# Patient Record
Sex: Female | Born: 1940 | Race: White | Hispanic: No | Smoking: Current every day smoker
Health system: Southern US, Community
[De-identification: ages and names within clinical notes are randomized; demographics above are authoritative.]

## PROBLEM LIST (undated history)

## (undated) DIAGNOSIS — E119 Type 2 diabetes mellitus without complications: Secondary | ICD-10-CM

## (undated) HISTORY — PX: CHOLECYSTECTOMY: SHX55

## (undated) HISTORY — PX: TONSILLECTOMY: SUR1361

## (undated) HISTORY — PX: BLEPHAROPLASTY: SUR158

## (undated) HISTORY — PX: KNEE ARTHROSCOPY: SHX127

---

## 2004-08-17 ENCOUNTER — Ambulatory Visit: Payer: Self-pay | Admitting: Internal Medicine

## 2006-12-23 ENCOUNTER — Ambulatory Visit: Payer: Self-pay | Admitting: Internal Medicine

## 2008-10-14 ENCOUNTER — Ambulatory Visit: Payer: Self-pay | Admitting: Internal Medicine

## 2009-10-25 ENCOUNTER — Ambulatory Visit: Payer: Self-pay | Admitting: Internal Medicine

## 2009-11-16 ENCOUNTER — Ambulatory Visit: Payer: Self-pay | Admitting: Internal Medicine

## 2010-04-18 ENCOUNTER — Ambulatory Visit (HOSPITAL_COMMUNITY): Admission: RE | Admit: 2010-04-18 | Discharge: 2010-04-18 | Payer: Self-pay | Admitting: Orthopedic Surgery

## 2011-01-28 LAB — COMPREHENSIVE METABOLIC PANEL
ALT: 18 U/L (ref 0–35)
AST: 19 U/L (ref 0–37)
BUN: 8 mg/dL (ref 6–23)
CO2: 29 mEq/L (ref 19–32)
Calcium: 10 mg/dL (ref 8.4–10.5)
Chloride: 107 mEq/L (ref 96–112)
GFR calc Af Amer: >60 mL/min (ref >60)
GFR calc non Af Amer: >60 mL/min (ref >60)
Glucose, Bld: 148 mg/dL — ABNORMAL HIGH (ref 70–99)
Sodium: 143 mEq/L (ref 135–145)
Total Bilirubin: 0.5 mg/dL (ref 0.3–1.2)
Total Protein: 7.2 g/dL (ref 6.0–8.3)

## 2011-01-28 LAB — DIFFERENTIAL
Lymphocytes Relative: 22 % (ref 12–46)
Monocytes Absolute: 0.6 10*3/uL (ref 0.1–1.0)
Monocytes Relative: 5 % (ref 3–12)

## 2011-01-28 LAB — CBC
HCT: 46.2 % — ABNORMAL HIGH (ref 36.0–46.0)
MCV: 93.5 fL (ref 78.0–100.0)
RBC: 4.94 MIL/uL (ref 3.87–5.11)
RDW: 14.1 % (ref 11.5–15.5)

## 2011-01-28 LAB — GLUCOSE, CAPILLARY: Glucose-Capillary: 132 mg/dL — ABNORMAL HIGH (ref 70–99)

## 2011-01-28 LAB — APTT: aPTT: 32 seconds (ref 24–37)

## 2011-01-28 LAB — URINALYSIS, ROUTINE W REFLEX MICROSCOPIC
Bilirubin Urine: NEGATIVE
Nitrite: NEGATIVE
pH: 6.5 (ref 5.0–8.0)

## 2014-09-14 ENCOUNTER — Ambulatory Visit: Payer: Self-pay | Admitting: Internal Medicine

## 2014-10-18 ENCOUNTER — Ambulatory Visit: Payer: Self-pay | Admitting: Internal Medicine

## 2019-07-14 ENCOUNTER — Other Ambulatory Visit: Payer: Self-pay | Admitting: Internal Medicine

## 2019-07-14 DIAGNOSIS — D119 Benign neoplasm of major salivary gland, unspecified: Secondary | ICD-10-CM

## 2019-08-14 ENCOUNTER — Ambulatory Visit
Admission: RE | Admit: 2019-08-14 | Discharge: 2019-08-14 | Disposition: A | Discharge status: Home / Self Care | Payer: Medicare Other | Source: Ambulatory Visit | Attending: Internal Medicine | Admitting: Internal Medicine

## 2019-08-14 ENCOUNTER — Other Ambulatory Visit: Payer: Self-pay

## 2019-08-14 DIAGNOSIS — D119 Benign neoplasm of major salivary gland, unspecified: Secondary | ICD-10-CM | POA: Diagnosis present

## 2019-08-14 LAB — POCT I-STAT CREATININE: Creatinine, Ser: 1 mg/dL (ref 0.44–1.00)

## 2019-08-14 MED ORDER — GADOBUTROL 1 MMOL/ML IV SOLN
6.0000 mL | Freq: Once | INTRAVENOUS | Status: AC | PRN
  Administered 2019-08-14: 13:00:00 6 mL via INTRAVENOUS

## 2019-10-13 ENCOUNTER — Encounter
Admission: RE | Admit: 2019-10-13 | Discharge: 2019-10-13 | Disposition: A | Discharge status: Home / Self Care | Payer: Medicare Other | Source: Ambulatory Visit | Attending: Otolaryngology | Admitting: Otolaryngology

## 2019-10-13 ENCOUNTER — Other Ambulatory Visit: Payer: Self-pay

## 2019-10-13 DIAGNOSIS — Z01812 Encounter for preprocedural laboratory examination: Secondary | ICD-10-CM | POA: Insufficient documentation

## 2019-10-13 HISTORY — DX: Type 2 diabetes mellitus without complications: E11.9

## 2019-10-13 LAB — BASIC METABOLIC PANEL
Anion gap: 9 (ref 5–15)
BUN: 12 mg/dL (ref 8–23)
CO2: 26 mmol/L (ref 22–32)
Calcium: 9.5 mg/dL (ref 8.9–10.3)
Chloride: 107 mmol/L (ref 98–111)
Creatinine, Ser: 0.92 mg/dL (ref 0.44–1.00)
GFR calc Af Amer: >60 mL/min (ref >60)
GFR calc non Af Amer: 59 mL/min — ABNORMAL LOW (ref >60)
Glucose, Bld: 54 mg/dL — ABNORMAL LOW (ref 70–99)
Potassium: 3.6 mmol/L (ref 3.5–5.1)
Sodium: 142 mmol/L (ref 135–145)

## 2019-10-13 NOTE — Patient Instructions (Signed)
Your COVID swab is scheduled on Friday 10/15/2019 any time between 8:00-10:30am. Drive up in front of the [facility] and remain in your vehicle.  Your procedure is scheduled on: Wednesday 10/20/2019 Report to Same Day Surgery 2nd floor [facility] Entrance-take elevator on left to 2nd floor.  Check in with surgery information desk.) To find out your arrival time, call [phone] 1:00-3:00 PM on Tuesday 10/19/2019  Remember: Instructions that are not followed completely may result in serious medical risk, up to and including death, or upon the discretion of your surgeon and anesthesiologist your surgery may need to be rescheduled.    __x__ 1. Do not eat food (including mints, candies, chewing gum) after midnight the night before your procedure. You may drink water up to 2 hours before you are scheduled to arrive at the hospital for your procedure.  Do not drink anything within 2 hours of your scheduled arrival to the hospital.    __x__ 2. No Alcohol for 24 hours before or after surgery.   __x__ 3. No Smoking or e-cigarettes for 24 hours before surgery.  Do not use any chewable tobacco products for at least 6 hours before surgery.   __x__ 4. Notify your doctor if there is any change in your medical condition (cold, fever, infections).   __x__ 5. On the morning of surgery brush your teeth with toothpaste and water.  You may rinse your mouth with mouthwash if you wish.  Do not swallow any toothpaste or mouthwash.  Please read over the following fact sheets that you were given:   [facility] Preparing for Surgery and/or MRSA Information    __x__ Use CHG Soap as directed on instruction sheet.   Do not wear jewelry, make-up, hairpins, clips or nail polish on the day of surgery.  Do not wear lotions, powders, deodorant, or perfumes.   Do not shave below the face/neck 48 hours prior to surgery.   Do not bring valuables to the hospital.    [facility] is not responsible  for any belongings or valuables.               Contacts, dentures or bridgework may not be worn into surgery.  For patients admitted to the hospital, discharge time is determined by your treatment team.  For patients discharged on the day of surgery, you will NOT be permitted to drive yourself home.  You must have a responsible adult with you for 24 hours after surgery.  __x__ Take these medicines on the morning of surgery with a SMALL SIP OF WATER:  1. Fexofenadine (Allegra)  __x__ Take 1/2 of usual insulin dose the night before surgery (17 units instead of 35 units) and none on the morning of surgery.   __x__ Follow recommendations from Cardiologist, Pulmonologist or PCP regarding stopping Aspirin, Coumadin, Plavix, Eliquis, Effient, Pradaxa, and Pletal.  __x__ TODAY: Stop Anti-inflammatories such as Advil, Ibuprofen, Motrin, Aleve, Naproxen, Naprosyn, BC/Goodies powders or aspirin products. You may continue to take Tylenol and Celebrex.   __x__ TODAY: Stop supplements until after surgery. You may continue to take Vitamin D, Vitamin B, and multivitamin.

## 2019-10-15 ENCOUNTER — Other Ambulatory Visit
Admission: RE | Admit: 2019-10-15 | Discharge: 2019-10-15 | Disposition: A | Discharge status: Home / Self Care | Payer: Medicare Other | Source: Ambulatory Visit | Attending: Otolaryngology | Admitting: Otolaryngology

## 2019-10-15 DIAGNOSIS — Z01812 Encounter for preprocedural laboratory examination: Secondary | ICD-10-CM | POA: Diagnosis present

## 2019-10-15 DIAGNOSIS — Z20828 Contact with and (suspected) exposure to other viral communicable diseases: Secondary | ICD-10-CM | POA: Insufficient documentation

## 2019-10-15 LAB — SARS CORONAVIRUS 2 (TAT 6-24 HRS): SARS Coronavirus 2: NEGATIVE

## 2019-10-20 ENCOUNTER — Encounter
Admission: RE | Disposition: A | Discharge status: Home / Self Care | Payer: Self-pay | Source: Home / Self Care | Attending: Otolaryngology

## 2019-10-20 ENCOUNTER — Encounter: Payer: Self-pay | Admitting: *Deleted

## 2019-10-20 ENCOUNTER — Observation Stay
Admission: RE | Admit: 2019-10-20 | Discharge: 2019-10-21 | Disposition: A | Discharge status: Home / Self Care | Payer: Medicare Other | Attending: Otolaryngology | Admitting: Otolaryngology

## 2019-10-20 ENCOUNTER — Ambulatory Visit: Payer: Medicare Other | Admitting: Anesthesiology

## 2019-10-20 ENCOUNTER — Other Ambulatory Visit: Payer: Self-pay

## 2019-10-20 DIAGNOSIS — F17210 Nicotine dependence, cigarettes, uncomplicated: Secondary | ICD-10-CM | POA: Insufficient documentation

## 2019-10-20 DIAGNOSIS — F329 Major depressive disorder, single episode, unspecified: Secondary | ICD-10-CM | POA: Insufficient documentation

## 2019-10-20 DIAGNOSIS — Z79899 Other long term (current) drug therapy: Secondary | ICD-10-CM | POA: Insufficient documentation

## 2019-10-20 DIAGNOSIS — D110 Benign neoplasm of parotid gland: Principal | ICD-10-CM | POA: Insufficient documentation

## 2019-10-20 DIAGNOSIS — E119 Type 2 diabetes mellitus without complications: Secondary | ICD-10-CM

## 2019-10-20 DIAGNOSIS — Z794 Long term (current) use of insulin: Secondary | ICD-10-CM | POA: Diagnosis not present

## 2019-10-20 DIAGNOSIS — D37030 Neoplasm of uncertain behavior of the parotid salivary glands: Secondary | ICD-10-CM | POA: Diagnosis present

## 2019-10-20 HISTORY — PX: PAROTIDECTOMY: SHX2163

## 2019-10-20 LAB — GLUCOSE, CAPILLARY
Glucose-Capillary: 133 mg/dL — ABNORMAL HIGH (ref 70–99)
Glucose-Capillary: 159 mg/dL — ABNORMAL HIGH (ref 70–99)
Glucose-Capillary: 191 mg/dL — ABNORMAL HIGH (ref 70–99)
Glucose-Capillary: 243 mg/dL — ABNORMAL HIGH (ref 70–99)
Glucose-Capillary: 245 mg/dL — ABNORMAL HIGH (ref 70–99)
Glucose-Capillary: 291 mg/dL — ABNORMAL HIGH (ref 70–99)

## 2019-10-20 LAB — HEMOGLOBIN A1C
Hgb A1c MFr Bld: 6.4 % — ABNORMAL HIGH (ref 4.8–5.6)
Mean Plasma Glucose: 136.98 mg/dL

## 2019-10-20 SURGERY — EXCISION, PAROTID GLAND
Anesthesia: General | Laterality: Left

## 2019-10-20 MED ORDER — FAMOTIDINE 20 MG PO TABS
ORAL_TABLET | ORAL | Status: AC
  Filled 2019-10-20: qty 1

## 2019-10-20 MED ORDER — NICOTINE 21 MG/24HR TD PT24
21.0000 mg | MEDICATED_PATCH | Freq: Every day | TRANSDERMAL | Status: DC
  Administered 2019-10-20: 21 mg via TRANSDERMAL
  Filled 2019-10-20: qty 1

## 2019-10-20 MED ORDER — PROPOFOL 10 MG/ML IV BOLUS
INTRAVENOUS | Status: DC | PRN
  Administered 2019-10-20 (×3): 50 mg via INTRAVENOUS
  Administered 2019-10-20: 100 mg via INTRAVENOUS

## 2019-10-20 MED ORDER — BUPROPION HCL ER (SR) 150 MG PO TB12
150.0000 mg | ORAL_TABLET | Freq: Two times a day (BID) | ORAL | Status: DC
  Filled 2019-10-20 (×2): qty 1

## 2019-10-20 MED ORDER — FENTANYL CITRATE (PF) 100 MCG/2ML IJ SOLN
INTRAMUSCULAR | Status: AC
  Filled 2019-10-20: qty 2

## 2019-10-20 MED ORDER — SUCCINYLCHOLINE CHLORIDE 20 MG/ML IJ SOLN
INTRAMUSCULAR | Status: DC | PRN
  Administered 2019-10-20: 100 mg via INTRAVENOUS

## 2019-10-20 MED ORDER — INSULIN ASPART 100 UNIT/ML ~~LOC~~ SOLN
0.0000 [IU] | Freq: Every day | SUBCUTANEOUS | Status: DC
  Administered 2019-10-20: 3 [IU] via SUBCUTANEOUS
  Filled 2019-10-20: qty 1

## 2019-10-20 MED ORDER — FAMOTIDINE 20 MG PO TABS
20.0000 mg | ORAL_TABLET | Freq: Once | ORAL | Status: AC
  Administered 2019-10-20: 20 mg via ORAL

## 2019-10-20 MED ORDER — MAGNESIUM HYDROXIDE 400 MG/5ML PO SUSP: 30.0000 mL | Freq: Every day | ORAL | Status: DC | PRN

## 2019-10-20 MED ORDER — LIDOCAINE-EPINEPHRINE (PF) 1 %-1:200000 IJ SOLN
INTRAMUSCULAR | Status: DC | PRN
  Administered 2019-10-20: 7 mL

## 2019-10-20 MED ORDER — EPHEDRINE SULFATE 50 MG/ML IJ SOLN
INTRAMUSCULAR | Status: DC | PRN
  Administered 2019-10-20: 10 mg via INTRAVENOUS

## 2019-10-20 MED ORDER — SODIUM CHLORIDE 0.45 % IV SOLN
INTRAVENOUS | Status: DC
  Administered 2019-10-20 – 2019-10-21 (×2): via INTRAVENOUS

## 2019-10-20 MED ORDER — INSULIN DETEMIR 100 UNIT/ML ~~LOC~~ SOLN
15.0000 [IU] | Freq: Two times a day (BID) | SUBCUTANEOUS | Status: DC
  Administered 2019-10-20 (×2): 15 [IU] via SUBCUTANEOUS
  Filled 2019-10-20 (×4): qty 0.15

## 2019-10-20 MED ORDER — PHENYLEPHRINE HCL-NACL 10-0.9 MG/250ML-% IV SOLN
INTRAVENOUS | Status: DC | PRN
  Administered 2019-10-20: 50 ug/min via INTRAVENOUS

## 2019-10-20 MED ORDER — ONDANSETRON HCL 4 MG/2ML IJ SOLN
INTRAMUSCULAR | Status: DC | PRN
  Administered 2019-10-20: 4 mg via INTRAVENOUS

## 2019-10-20 MED ORDER — NAPROXEN 250 MG PO TABS
250.0000 mg | ORAL_TABLET | Freq: Every day | ORAL | Status: DC | PRN
  Filled 2019-10-20: qty 1

## 2019-10-20 MED ORDER — REMIFENTANIL HCL 1 MG IV SOLR
INTRAVENOUS | Status: AC
  Filled 2019-10-20: qty 1000

## 2019-10-20 MED ORDER — INSULIN ASPART PROT & ASPART (70-30 MIX) 100 UNIT/ML ~~LOC~~ SUSP: 30.0000 [IU] | Freq: Two times a day (BID) | SUBCUTANEOUS | Status: DC

## 2019-10-20 MED ORDER — ACETAMINOPHEN 325 MG PO TABS
650.0000 mg | ORAL_TABLET | Freq: Four times a day (QID) | ORAL | Status: DC | PRN
  Administered 2019-10-20: 650 mg via ORAL
  Filled 2019-10-20: qty 2

## 2019-10-20 MED ORDER — FENTANYL CITRATE (PF) 100 MCG/2ML IJ SOLN
INTRAMUSCULAR | Status: DC | PRN
  Administered 2019-10-20: 100 ug via INTRAVENOUS
  Administered 2019-10-20: 50 ug via INTRAVENOUS

## 2019-10-20 MED ORDER — ACETAMINOPHEN 10 MG/ML IV SOLN
INTRAVENOUS | Status: AC
  Filled 2019-10-20: qty 100

## 2019-10-20 MED ORDER — ACETAMINOPHEN 10 MG/ML IV SOLN
INTRAVENOUS | Status: DC | PRN
  Administered 2019-10-20: 1000 mg via INTRAVENOUS

## 2019-10-20 MED ORDER — SODIUM CHLORIDE 0.9 % IV SOLN
INTRAVENOUS | Status: DC
  Administered 2019-10-20: 07:00:00 via INTRAVENOUS

## 2019-10-20 MED ORDER — INFLUENZA VAC A&B SA ADJ QUAD 0.5 ML IM PRSY
0.5000 mL | PREFILLED_SYRINGE | INTRAMUSCULAR | Status: DC
  Filled 2019-10-20: qty 0.5

## 2019-10-20 MED ORDER — LIDOCAINE HCL (CARDIAC) PF 100 MG/5ML IV SOSY
PREFILLED_SYRINGE | INTRAVENOUS | Status: DC | PRN
  Administered 2019-10-20: 100 mg via INTRAVENOUS

## 2019-10-20 MED ORDER — GLYCOPYRROLATE 0.2 MG/ML IJ SOLN
INTRAMUSCULAR | Status: DC | PRN
  Administered 2019-10-20: 0.2 mg via INTRAVENOUS

## 2019-10-20 MED ORDER — INSULIN ASPART 100 UNIT/ML ~~LOC~~ SOLN
0.0000 [IU] | Freq: Three times a day (TID) | SUBCUTANEOUS | Status: DC
  Administered 2019-10-20 (×2): 3 [IU] via SUBCUTANEOUS
  Filled 2019-10-20 (×2): qty 1

## 2019-10-20 MED ORDER — SODIUM CHLORIDE 0.9 % IV SOLN
INTRAVENOUS | Status: DC | PRN
  Administered 2019-10-20: .05 ug/kg/min via INTRAVENOUS

## 2019-10-20 MED ORDER — BISACODYL 5 MG PO TBEC: 5.0000 mg | DELAYED_RELEASE_TABLET | Freq: Every day | ORAL | Status: DC | PRN

## 2019-10-20 MED ORDER — BACITRACIN ZINC 500 UNIT/GM EX OINT
TOPICAL_OINTMENT | CUTANEOUS | Status: AC
  Filled 2019-10-20: qty 28.35

## 2019-10-20 MED ORDER — PROPOFOL 10 MG/ML IV BOLUS
INTRAVENOUS | Status: AC
  Filled 2019-10-20: qty 60

## 2019-10-20 MED ORDER — EPINEPHRINE PF 1 MG/ML IJ SOLN
INTRAMUSCULAR | Status: AC
  Filled 2019-10-20: qty 1

## 2019-10-20 MED ORDER — FLEET ENEMA 7-19 GM/118ML RE ENEM: 1.0000 | ENEMA | Freq: Once | RECTAL | Status: DC | PRN

## 2019-10-20 MED ORDER — MORPHINE SULFATE (PF) 4 MG/ML IV SOLN
3.0000 mg | INTRAVENOUS | Status: DC | PRN
  Administered 2019-10-20: 3 mg via INTRAVENOUS
  Filled 2019-10-20: qty 1

## 2019-10-20 MED ORDER — POLYVINYL ALCOHOL 1.4 % OP SOLN
1.0000 [drp] | Freq: Every day | OPHTHALMIC | Status: DC | PRN
  Filled 2019-10-20: qty 15

## 2019-10-20 MED ORDER — DEXAMETHASONE SODIUM PHOSPHATE 10 MG/ML IJ SOLN
INTRAMUSCULAR | Status: DC | PRN
  Administered 2019-10-20: 4 mg via INTRAVENOUS

## 2019-10-20 MED ORDER — LIDOCAINE HCL (PF) 1 % IJ SOLN
INTRAMUSCULAR | Status: AC
  Filled 2019-10-20: qty 30

## 2019-10-20 MED ORDER — HYDROCODONE-ACETAMINOPHEN 5-325 MG PO TABS
1.0000 | ORAL_TABLET | ORAL | Status: DC | PRN
  Administered 2019-10-20 (×2): 1 via ORAL
  Filled 2019-10-20 (×2): qty 1

## 2019-10-20 MED ORDER — PHENYLEPHRINE HCL (PRESSORS) 10 MG/ML IV SOLN
INTRAVENOUS | Status: DC | PRN
  Administered 2019-10-20 (×3): 100 ug via INTRAVENOUS

## 2019-10-20 MED ORDER — FENTANYL CITRATE (PF) 100 MCG/2ML IJ SOLN: 25.0000 ug | INTRAMUSCULAR | Status: DC | PRN

## 2019-10-20 MED ORDER — ONDANSETRON HCL 4 MG/2ML IJ SOLN: 4.0000 mg | Freq: Once | INTRAMUSCULAR | Status: DC | PRN

## 2019-10-20 MED ORDER — ONDANSETRON 4 MG PO TBDP: 4.0000 mg | ORAL_TABLET | Freq: Four times a day (QID) | ORAL | Status: DC | PRN

## 2019-10-20 MED ORDER — DEXTROSE-NACL 5-0.45 % IV SOLN: INTRAVENOUS | Status: DC

## 2019-10-20 MED ORDER — ONDANSETRON HCL 4 MG/2ML IJ SOLN: 4.0000 mg | Freq: Four times a day (QID) | INTRAMUSCULAR | Status: DC | PRN

## 2019-10-20 SURGICAL SUPPLY — 42 items
BLADE SURG 15 STRL LF DISP TIS (BLADE) ×1 IMPLANT
BLADE SURG 15 STRL SS (BLADE) ×2
CORD BIP STRL DISP 12FT (MISCELLANEOUS) ×3 IMPLANT
COVER WAND RF STERILE (DRAPES) ×3 IMPLANT
DRAIN TLS ROUND 10FR (DRAIN) IMPLANT
DRAPE INCISE 23X17 IOBAN STRL (DRAPES) ×4
DRAPE INCISE IOBAN 23X17 STRL (DRAPES) ×2 IMPLANT
DRAPE MAG INST 16X20 L/F (DRAPES) ×3 IMPLANT
DRSG TEGADERM 2-3/8X2-3/4 SM (GAUZE/BANDAGES/DRESSINGS) ×9 IMPLANT
DRSG TEGADERM 4X4.75 (GAUZE/BANDAGES/DRESSINGS) ×6 IMPLANT
DRSG TELFA 4X3 1S NADH ST (GAUZE/BANDAGES/DRESSINGS) ×3 IMPLANT
ELECT CAUTERY BLADE TIP 2.5 (TIP) ×3
ELECT EMG 20MM DUAL (MISCELLANEOUS) ×6
ELECT NEEDLE 20X.3 GREEN (MISCELLANEOUS) ×3
ELECT REM PT RETURN 9FT ADLT (ELECTROSURGICAL) ×3
ELECTRODE CAUTERY BLDE TIP 2.5 (TIP) ×1 IMPLANT
ELECTRODE EMG 20MM DUAL (MISCELLANEOUS) ×2 IMPLANT
ELECTRODE NEEDLE 20X.3 GREEN (MISCELLANEOUS) ×1 IMPLANT
ELECTRODE REM PT RTRN 9FT ADLT (ELECTROSURGICAL) ×1 IMPLANT
FORCEPS JEWEL BIP 4-3/4 STR (INSTRUMENTS) ×3 IMPLANT
GAUZE 4X4 16PLY RFD (DISPOSABLE) ×3 IMPLANT
GLOVE BIO SURGEON STRL SZ7.5 (GLOVE) ×3 IMPLANT
GLOVE PROTEXIS LATEX SZ 7.5 (GLOVE) ×3 IMPLANT
GOWN STRL REUS W/ TWL LRG LVL3 (GOWN DISPOSABLE) ×3 IMPLANT
GOWN STRL REUS W/TWL LRG LVL3 (GOWN DISPOSABLE) ×6
HOOK STAY BLUNT/RETRACTOR 5M (MISCELLANEOUS) ×3 IMPLANT
JACKSON PRATT 7MM (INSTRUMENTS) ×3 IMPLANT
LABEL OR SOLS (LABEL) IMPLANT
PACK HEAD/NECK (MISCELLANEOUS) ×3 IMPLANT
PROBE MONO 100X0.75 ELECT 1.9M (MISCELLANEOUS) ×3 IMPLANT
SHEARS HARMONIC 9CM CVD (BLADE) ×3 IMPLANT
SPONGE DRAIN TRACH 4X4 STRL 2S (GAUZE/BANDAGES/DRESSINGS) ×3 IMPLANT
SPONGE KITTNER 5P (MISCELLANEOUS) ×6 IMPLANT
SUT ETHILON 3-0 FS-10 30 BLK (SUTURE) ×3
SUT ETHILON 6 0 9-3 1X18 BLK (SUTURE) ×3 IMPLANT
SUT MERSILENE 4-0 WHT RB-1 (SUTURE) ×6 IMPLANT
SUT PROLENE 6 0 PC 1 (SUTURE) ×6 IMPLANT
SUT SILK 2 0 (SUTURE) ×2
SUT SILK 2-0 18XBRD TIE 12 (SUTURE) ×1 IMPLANT
SUT VIC AB 4-0 RB1 18 (SUTURE) ×3 IMPLANT
SUTURE EHLN 3-0 FS-10 30 BLK (SUTURE) ×1 IMPLANT
SYSTEM CHEST DRAIN TLS 7FR (DRAIN) IMPLANT

## 2019-10-20 NOTE — Transfer of Care (Signed)
Immediate Anesthesia Transfer of Care Note  Patient: Erica Hurley  Procedure(s) Performed: SUPERFICIAL PAROTIDECTOMY (Left )  Patient Location: PACU  Anesthesia Type:General  Level of Consciousness: awake, drowsy and patient cooperative  Airway & Oxygen Therapy: Patient Spontanous Breathing and Patient connected to face mask oxygen  Post-op Assessment: Report given to RN and Post -op Vital signs reviewed and stable  Post vital signs: Reviewed and stable  Last Vitals:  Vitals Value Taken Time  BP 107/59 10/20/19 1010  Temp 36.9 C 10/20/19 1010  Pulse 97 10/20/19 1013  Resp 24 10/20/19 1013  SpO2 99 % 10/20/19 1013  Vitals shown include unvalidated device data.  Last Pain:  Vitals:   10/20/19 0636  TempSrc: Tympanic  PainSc: 6          Complications: No apparent anesthesia complications

## 2019-10-20 NOTE — Progress Notes (Signed)
RN notified MD pt is requesting a nicotine patch per pt's daughter pt is a heavy smoker. Per MD okay for RN to order nicotine patch and tylenol for headache. RN will continue to assess and monitor pt.

## 2019-10-20 NOTE — Consult Note (Signed)
[facility] Medical Consultation  KENDLE PESKIN [id number] DOB: [birth year] DOA: 10/20/2019 PCP: Tracie Harrier, MD   Requesting physician: Dr. 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  Patient seen postop, she is a bit sleepy, but tells me that she is taking NPH/regular / 70/30, 62 units in the morning and 35 units in the evening.  She does not recall most recent A1c but says that her diabetes is fairly well controlled.  Currently she denies any pain, denies any chest discomfort, no shortness of breath.  Denies any fever or chills.  She just got out of the surgery following the left superficial parotidectomy and has a drain in place.  Review of Systems:  As per HPI, otherwise 10 point review of systems negative   Impression/Recommendations Active Problems:   Neoplasm of uncertain behavior of parotid gland  Insulin-dependent diabetes mellitus with hyperglycemia -Patient will be admitted overnight for postop observation, we were consulted for diabetes management.  Currently her CBG is 191 -She is n.p.o., will have clears and diet will slowly advance, will place patient on low-dose Levemir, 15 units twice daily, along with sliding scale for tighter control and hold her home 70/30  CBG (last 3)  Recent Labs    10/20/19 0638 10/20/19 0853 10/20/19 1014  GLUCAP 133* 159* 191*   The hospitalist team will followup again tomorrow. Please contact me if I can be of assistance in the meanwhile. Thank you for this consultation.   Past Medical History:  Diagnosis Date  . Diabetes mellitus without complication [facility])    Past Surgical History:  Procedure Laterality Date  . BLEPHAROPLASTY Bilateral   . CHOLECYSTECTOMY    .  KNEE ARTHROSCOPY Right   . TONSILLECTOMY     Social History:  reports that she has been smoking cigarettes. She has been smoking about 1.00 pack per day. She has never used smokeless tobacco. She reports that she does not drink alcohol or use drugs.  No Known Allergies History reviewed. No pertinent family history.  Prior to Admission medications   Medication Sig Start Date End Date Taking? Authorizing Provider  buPROPion (WELLBUTRIN SR) 150 MG 12 hr tablet Take 150 mg by mouth 2 (two) times daily. 07/08/19  Yes [provider]  fexofenadine (ALLEGRA) 180 MG tablet Take 180 mg by mouth daily.   Yes [provider]  hydroxypropyl methylcellulose / hypromellose (ISOPTO TEARS / GONIOVISC) 2.5 % ophthalmic solution Place 1 drop into both eyes daily as needed for dry eyes.   Yes [provider]  insulin NPH-regular Human (70-30) 100 UNIT/ML injection Inject 35-62 Units into the skin See admin instructions. Inject 62 units into the skin in the morning and 35 units in the evening 03/08/19  Yes [provider]  naproxen sodium (ALEVE) 220 MG tablet Take 220 mg by mouth daily as needed (pain).   Yes [provider]   Physical Exam: Blood pressure 117/69, pulse 89, temperature 98.4 F (36.9 C), resp. rate 16, height 5\' 4"  (1.626 m), weight 90.9 kg, SpO2 97 %. Vitals:   10/20/19 1050 10/20/19 1100  BP: 118/66 117/69  Pulse: 88 89  Resp: 17 16  Temp:    SpO2: 98% 97%     General: No distress  Eyes: No scleral icterus  ENT: Dressing and drain in place on her neck  Cardiovascular: Regular rate and rhythm, no murmurs.  No peripheral edema  Respiratory: Clear to auscultation on anterior lung fields, no wheezing, no crackles  Abdomen: Soft, nontender, nondistended, bowel sounds positive  Skin: No rashes appreciated  Psychiatric: Normal mood, alert and oriented x3  Neurologic: Cranial nerves grossly intact, no focal deficits and strength is equal   Labs on Admission:  Basic Metabolic Panel: Recent Labs  Lab 10/13/19 1546  NA 142  K 3.6  CL 107  CO2 26  GLUCOSE 54*  BUN 12  CREATININE 0.92  CALCIUM 9.5   Liver Function Tests: No results for input(s): AST, ALT, ALKPHOS, BILITOT, PROT, ALBUMIN in the last 168 hours. No results for input(s): LIPASE, AMYLASE in the last 168 hours. No results for input(s): AMMONIA in the last 168 hours. CBC: No results for input(s): WBC, NEUTROABS, HGB, HCT, MCV, PLT in the last 168 hours. Cardiac Enzymes: No results for input(s): CKTOTAL, CKMB, CKMBINDEX, TROPONINI in the last 168 hours. BNP: Invalid input(s): POCBNP CBG: Recent Labs  Lab 10/20/19 0638 10/20/19 0853 10/20/19 1014  GLUCAP 133* 159* 191*    Radiological Exams on Admission: No results found.   Navjot Pilgrim [facility]   If 7PM-7AM, please contact night-coverage [email or web address]  10/20/2019, 11:18 AM

## 2019-10-20 NOTE — Anesthesia Postprocedure Evaluation (Signed)
Anesthesia Post Note  Patient: Erica Hurley  Procedure(s) Performed: SUPERFICIAL PAROTIDECTOMY (Left )  Patient location during evaluation: PACU Anesthesia Type: General Level of consciousness: awake and alert and oriented Pain management: pain level controlled Vital Signs Assessment: post-procedure vital signs reviewed and stable Respiratory status: spontaneous breathing Cardiovascular status: blood pressure returned to baseline Anesthetic complications: no     Last Vitals:  Vitals:   10/20/19 1158 10/20/19 1233  BP: (!) 120/56 120/63  Pulse: 87 92  Resp: 17 16  Temp: 36.9 C 36.7 C  SpO2: 96% 97%    Last Pain:  Vitals:   10/20/19 1246  TempSrc:   PainSc: 4                  Valeria Boza

## 2019-10-20 NOTE — Anesthesia Post-op Follow-up Note (Signed)
Anesthesia QCDR form completed.        

## 2019-10-20 NOTE — Anesthesia Preprocedure Evaluation (Signed)
Anesthesia Evaluation  Patient identified by MRN, date of birth, ID band Patient awake    Reviewed: Allergy & Precautions, NPO status , Patient's Chart, lab work & pertinent test results  Airway Mallampati: III       Dental   Pulmonary Current Smoker and Patient abstained from smoking.,    Pulmonary exam normal        Cardiovascular negative cardio ROS Normal cardiovascular exam     Neuro/Psych negative neurological ROS  negative psych ROS   GI/Hepatic negative GI ROS, Neg liver ROS,   Endo/Other  diabetes  Renal/GU negative Renal ROS  negative genitourinary   Musculoskeletal negative musculoskeletal ROS (+)   Abdominal Normal abdominal exam  (+)   Peds negative pediatric ROS (+)  Hematology negative hematology ROS (+)   Anesthesia Other Findings Past Medical History: No date: Diabetes mellitus without complication (HCC)  Reproductive/Obstetrics                             Anesthesia Physical Anesthesia Plan  ASA: II  Anesthesia Plan: General   Post-op Pain Management:    Induction: Intravenous  PONV Risk Score and Plan:   Airway Management Planned: Oral ETT  Additional Equipment:   Intra-op Plan:   Post-operative Plan: Extubation in OR  Informed Consent: I have reviewed the patients History and Physical, chart, labs and discussed the procedure including the risks, benefits and alternatives for the proposed anesthesia with the patient or authorized representative who has indicated his/her understanding and acceptance.     Dental advisory given  Plan Discussed with: CRNA and Surgeon  Anesthesia Plan Comments:         Anesthesia Quick Evaluation

## 2019-10-20 NOTE — Progress Notes (Signed)
10/20/2019 7:52 PM  Hurley, Erica Duverney [id number]  Post-Op Check: The patient had her surgery earlier today and is feeling well.  She has very little discomfort.  She has good mobility of her and face and it is completely symmetrical.  Wound is flat and clean.  The drain is intact and has been emptied a couple times of a small amount of drainage.  He has been eating and drinking okay and feeling well.    Temp:  [97.1 F (36.2 C)-98.6 F (37 C)] 98.6 F (37 C) (12/09 1444) Pulse Rate:  [84-98] 84 (12/09 1444) Resp:  [12-20] 16 (12/09 1444) BP: (107-143)/(56-76) 118/71 (12/09 1444) SpO2:  [93 %-100 %] 95 % (12/09 1444) Weight:  [90.9 kg] 90.9 kg (12/09 0636),     Intake/Output Summary (Last 24 hours) at 10/20/2019 1952 Last data filed at 10/20/2019 1841 Gross per 24 hour  Intake 1191.92 ml  Output 475 ml  Net 716.92 ml    Results for orders placed or performed during the hospital encounter of 10/20/19 (from the past 24 hour(s))  Glucose, capillary     Status: Abnormal   Collection Time: 10/20/19  6:38 AM  Result Value Ref Range   Glucose-Capillary 133 (H) 70 - 99 mg/dL  Glucose, capillary     Status: Abnormal   Collection Time: 10/20/19  8:53 AM  Result Value Ref Range   Glucose-Capillary 159 (H) 70 - 99 mg/dL  Glucose, capillary     Status: Abnormal   Collection Time: 10/20/19 10:14 AM  Result Value Ref Range   Glucose-Capillary 191 (H) 70 - 99 mg/dL  Glucose, capillary     Status: Abnormal   Collection Time: 10/20/19 12:03 PM  Result Value Ref Range   Glucose-Capillary 243 (H) 70 - 99 mg/dL  Glucose, capillary     Status: Abnormal   Collection Time: 10/20/19  5:02 PM  Result Value Ref Range   Glucose-Capillary 245 (H) 70 - 99 mg/dL    SUBJECTIVE: As above  OBJECTIVE: The wound is flat and the dressing is dry.  Her drain is intact.  Facial movement is normal  IMPRESSION: She has done very well postop so far.  Her blood sugars are slightly elevated but she is being  followed with a sliding scale.  PLAN: We will continue to monitor blood sugars tonight.  Will see her in the morning to remove the drain and change her bandage.  We will plan to discharge home tomorrow.  [name] 10/20/2019, 7:52 PM

## 2019-10-20 NOTE — Anesthesia Procedure Notes (Signed)
Procedure Name: Intubation Date/Time: 10/20/2019 7:49 AM Performed by: Lowry Bowl, CRNA Pre-anesthesia Checklist: Patient identified, Emergency Drugs available, Suction available, Patient being monitored and Timeout performed Patient Re-evaluated:Patient Re-evaluated prior to induction Oxygen Delivery Method: Circle system utilized Preoxygenation: Pre-oxygenation with 100% oxygen Induction Type: IV induction and Cricoid Pressure applied Ventilation: Mask ventilation without difficulty Laryngoscope Size: Mac and 3 Grade View: Grade I Tube type: Oral Tube size: 7.0 mm Number of attempts: 1 Airway Equipment and Method: Stylet Placement Confirmation: ETT inserted through vocal cords under direct vision,  positive ETCO2 and breath sounds checked- equal and bilateral Secured at: 21 cm Tube secured with: Tape Dental Injury: Teeth and Oropharynx as per pre-operative assessment

## 2019-10-20 NOTE — Op Note (Signed)
DATE 10/20/2019  TIME 10:15 AM    Patient Name   Erica Hurley, Erica Hurley  MRN   [id number]   Pre-Op Dx: Left parotid tumor  Post-op Dx: Left parotid tumor  Proc: Left superficial parotidectomy with excision of tumor and sparing of the facial nerve.  Advancement flap closure of the wound.  Surg: Elon Alas Braeton Wolgamott  Asst: Osie Cheeks  Anes:  GOT  EBL: 30 mL  Comp: None  Findings: Large oval mass in the left inferior parotid directly overlying the cervical and marginal mandibular branches of the facial nerve.  Large parotid gland on the left side.  The facial nerve was intact and stimulated well post excision.  Procedure: The patient was given general anesthesia by oral endotracheal intubation.  Her neck was extended slightly and rotated to the right side so that the left neck and side of her face could be seen easily.  The skin incision was marked starting in front of the left auricle and extending around the earlobe and down into the upper neck just posterior to the parotid mass.  7 mL of 1% Xylocaine with epi 1-100,000 were used for infiltration into the subcu area surrounding the incision line.  Electrodes were then placed into the periorbital muscle superiorly and then the periaural muscle of the lower lip.  Grounds were placed into the cheek area.  These were taped down with Tegaderm and then the face was prepped and draped in a sterile fashion.  The skin incision was carried down through to the subcu.  Double hooks were used to hold the anterior edge and a subcutaneous flap was elevated over the anterior skin.  The posterior skin flaps were elevated as well.  Dural hooks were used to hold these retracted back.  The parotid tissue was then freed up from the sternocleidomastoid muscle and lifted forward.  The mass was evident low in the parotid and along the mandible border.  The sternocleidomastoid muscle was freed up underneath this.  Dissection was carried superiorly to free the parotid  tissue from the cartilage pointer of the auricle.  As dissection was carried deeper the styloid process was found and then the nerve was found running transverse just beneath the styloid process and above the mastoid tip.  This was followed out into the parotid elevating the parotid off the superficial border.  About a centimeter from its exit of the skull it split into a superior branch and an inferior branch.  The inferior branch was followed first elevating the parotid tissue over the top of it.  The inferior branch was found to coursed directly underneath the tumor.  The tumor had a thick capsule and the nerve was separated from this and left down in the wound to protect the nerve.  The nerve stimulator was used to follow small branches to protect them.  The upper parotid was then separated from the upper branch and was laid forward and downward.  The entire upper branch did not need to be dissected out.  There was a middle branch that was found in the dissected out and left down in the wound as the superficial parotid tissue was laid forward to separate the nerves and deep tissue.  Once I was beyond the middle branch the remaining parotid tissue was separated from the deep lobe to allow the superficial lobe and tumor to be freed and sent as a specimen in formalin.  Approximately 2 cm of the SMAS was removed with the superficial lobe of  the parotid.  There was a small bump along the superior edge of this and this was sent as a small little piece with the specimen.  The wound was copiously irrigated and there is no significant bleeding anywhere.  The nerves were stimulated again and you could stimulate the superior branches of the inferior branches and the middle branch.  There is good mobility of the face.  A 7 mm Jackson-Pratt drain was then placed into the wound and brought out through a separate inferior stab incision.  This was sutured in place with a 3-0 nylon suture.  The parotid fascia was then advanced  and pulled posterior to help close the defect.  4-0 Mersilene was used to suture this to the fascia overlying the sternocleidomastoid muscle.  This completely closed the defect using interrupted 4-0 Mersilene mattress sutures.  The skin was then draped back and there was overhanging skin now.  Approximately 2 cm of skin were trimmed off the anterior flap.  The skin flaps closed and were held in position using 4-0 Vicryls interrupted.  The skin edges were then held in place with a 6-0 Prolene running locking suture.  The wound was then covered with some bacitracin followed by Telfa and a pressure dressing.  The drain was placed to low continuous bulb suction.  The patient tolerated the procedure well.  She was awakened and taken to recovery room in satisfactory condition.  There were no operative complications.   Dispo: To PACU to be sent to the floor for observation overnight.  Plan:  Ice, elevation, analgesia, 23 hour suction drain.  We will see her in the morning and remove the drain and discharge home tomorrow  [name] 10/20/2019 10:13 AM

## 2019-10-20 NOTE — H&P (Signed)
H&P has been reviewed and patient reevaluated, no changes necessary. To be downloaded later.  

## 2019-10-21 DIAGNOSIS — D110 Benign neoplasm of parotid gland: Secondary | ICD-10-CM | POA: Diagnosis not present

## 2019-10-21 LAB — SURGICAL PATHOLOGY

## 2019-10-21 LAB — BASIC METABOLIC PANEL
Anion gap: 9 (ref 5–15)
BUN: 18 mg/dL (ref 8–23)
CO2: 25 mmol/L (ref 22–32)
Calcium: 9.4 mg/dL (ref 8.9–10.3)
Chloride: 104 mmol/L (ref 98–111)
Creatinine, Ser: 0.99 mg/dL (ref 0.44–1.00)
GFR calc Af Amer: >60 mL/min (ref >60)
GFR calc non Af Amer: 54 mL/min — ABNORMAL LOW (ref >60)
Glucose, Bld: 253 mg/dL — ABNORMAL HIGH (ref 70–99)
Potassium: 4.7 mmol/L (ref 3.5–5.1)
Sodium: 138 mmol/L (ref 135–145)

## 2019-10-21 LAB — CBC
HCT: 40.5 % (ref 36.0–46.0)
Hemoglobin: 13.2 g/dL (ref 12.0–15.0)
MCH: 30.3 pg (ref 26.0–34.0)
MCHC: 32.6 g/dL (ref 30.0–36.0)
MCV: 93.1 fL (ref 80.0–100.0)
Platelets: 201 10*3/uL (ref 150–400)
RBC: 4.35 MIL/uL (ref 3.87–5.11)
RDW: 13.9 % (ref 11.5–15.5)
WBC: 15.1 10*3/uL — ABNORMAL HIGH (ref 4.0–10.5)
nRBC: 0 % (ref 0.0–0.2)

## 2019-10-21 LAB — GLUCOSE, CAPILLARY: Glucose-Capillary: 196 mg/dL — ABNORMAL HIGH (ref 70–99)

## 2019-10-21 MED ORDER — HYDROCODONE-ACETAMINOPHEN 5-325 MG PO TABS: 1.0000 | ORAL_TABLET | Freq: Four times a day (QID) | ORAL | 0 refills | Status: AC | PRN

## 2019-10-21 MED ORDER — INFLUENZA VAC A&B SA ADJ QUAD 0.5 ML IM PRSY: 0.5000 mL | PREFILLED_SYRINGE | INTRAMUSCULAR | 0 refills | Status: AC

## 2019-10-21 NOTE — Discharge Summary (Signed)
Physician Discharge Summary  Patient ID: Erica Hurley MRN: [id number] DOB/AGE: 1941-08-01 78 y.o.  Admit date: 10/20/2019 Discharge date: 10/21/2019  Admission Diagnoses: Left parotid tumor  Discharge Diagnoses: Same Active Problems:   Neoplasm of uncertain behavior of parotid gland   Type 2 diabetes mellitus without complication, with long-term current use of insulin [facility])   Discharged Condition: good  Hospital Course: The patient had surgery yesterday for removal of her parotid gland.  She has good mobility of her face with no sign of any weakness of the facial nerve.  Her wound is remained flat with very little drainage.  The drain was removed this morning.  She is eating well.  Her blood sugars been up little bit and she will continue to monitor these at home and get back on her regular insulin doses.  We will see her back in the office in 6 days for suture removal.  Consults: None  Significant Diagnostic Studies: None  Treatments: She had excision of her left superficial parotid gland with removal of the tumor.  Discharge Exam: Blood pressure (!) 155/62, pulse 80, temperature 97.6 F (36.4 C), temperature source Oral, resp. rate 20, height 5\' 4"  (1.626 m), weight 90.9 kg, SpO2 95 %. Incision/Wound: The wound is flat and the drain is removed.  The bandage is replaced.  The flaps are healing well.  Disposition: Discharge disposition: 01-Home or Self Care       Discharge Instructions    Call MD for:  hives   Complete by: As directed    Call MD for:  persistant nausea and vomiting   Complete by: As directed    Call MD for:  redness, tenderness, or signs of infection (pain, swelling, redness, odor or green/yellow discharge around incision site)   Complete by: As directed    Call MD for:  temperature >100.4   Complete by: As directed    Increase activity slowly   Complete by: As directed      Allergies as of 10/21/2019   No Known Allergies     Medication List    TAKE these medications   buPROPion 150 MG 12 hr tablet Commonly known as: WELLBUTRIN SR Take 150 mg by mouth 2 (two) times daily.   fexofenadine 180 MG tablet Commonly known as: ALLEGRA Take 180 mg by mouth daily.   HYDROcodone-acetaminophen 5-325 MG tablet Commonly known as: NORCO/VICODIN Take 1-2 tablets by mouth every 6 (six) hours as needed for up to 5 days for moderate pain.   hydroxypropyl methylcellulose / hypromellose 2.5 % ophthalmic solution Commonly known as: ISOPTO TEARS / GONIOVISC Place 1 drop into both eyes daily as needed for dry eyes.   influenza vaccine adjuvanted 0.5 ML injection Commonly known as: FLUAD Inject 0.5 mLs into the muscle tomorrow at 10 am for 1 dose.   insulin NPH-regular Human (70-30) 100 UNIT/ML injection Inject 35-62 Units into the skin See admin instructions. Inject 62 units into the skin in the morning and 35 units in the evening   naproxen sodium 220 MG tablet Commonly known as: ALEVE Take 220 mg by mouth daily as needed (pain).        SignedHuey Romans 10/21/2019, 6:57 AM

## 2022-02-20 ENCOUNTER — Other Ambulatory Visit: Payer: Self-pay | Admitting: Dermatology

## 2022-02-20 ENCOUNTER — Other Ambulatory Visit: Payer: Self-pay | Admitting: Internal Medicine

## 2022-02-20 DIAGNOSIS — C4442 Squamous cell carcinoma of skin of scalp and neck: Secondary | ICD-10-CM

## 2022-03-02 ENCOUNTER — Ambulatory Visit
Admission: RE | Admit: 2022-03-02 | Discharge: 2022-03-02 | Disposition: A | Discharge status: Home / Self Care | Payer: Medicare Other | Source: Ambulatory Visit | Attending: Dermatology | Admitting: Dermatology

## 2022-03-02 DIAGNOSIS — C4442 Squamous cell carcinoma of skin of scalp and neck: Secondary | ICD-10-CM | POA: Diagnosis present

## 2022-03-04 MED ORDER — GADOBUTROL 1 MMOL/ML IV SOLN
9.0000 mL | Freq: Once | INTRAVENOUS | Status: AC | PRN
  Administered 2022-03-04: 9 mL via INTRAVENOUS

## 2022-08-15 ENCOUNTER — Emergency Department
Admission: EM | Admit: 2022-08-15 | Discharge: 2022-08-15 | Disposition: A | Discharge status: Home / Self Care | Payer: Medicare Other | Attending: Emergency Medicine | Admitting: Emergency Medicine

## 2022-08-15 ENCOUNTER — Emergency Department: Payer: Medicare Other

## 2022-08-15 ENCOUNTER — Other Ambulatory Visit: Payer: Self-pay

## 2022-08-15 ENCOUNTER — Encounter: Payer: Self-pay | Admitting: Emergency Medicine

## 2022-08-15 DIAGNOSIS — W010XXA Fall on same level from slipping, tripping and stumbling without subsequent striking against object, initial encounter: Secondary | ICD-10-CM | POA: Diagnosis not present

## 2022-08-15 DIAGNOSIS — S42295A Other nondisplaced fracture of upper end of left humerus, initial encounter for closed fracture: Secondary | ICD-10-CM | POA: Insufficient documentation

## 2022-08-15 DIAGNOSIS — R739 Hyperglycemia, unspecified: Secondary | ICD-10-CM | POA: Diagnosis not present

## 2022-08-15 DIAGNOSIS — S4992XA Unspecified injury of left shoulder and upper arm, initial encounter: Secondary | ICD-10-CM | POA: Diagnosis present

## 2022-08-15 LAB — CBG MONITORING, ED: Glucose-Capillary: 172 mg/dL — ABNORMAL HIGH (ref 70–99)

## 2022-08-15 MED ORDER — OXYCODONE-ACETAMINOPHEN 5-325 MG PO TABS
1.0000 | ORAL_TABLET | Freq: Once | ORAL | Status: AC
  Administered 2022-08-15: 1 via ORAL
  Filled 2022-08-15: qty 1

## 2022-08-15 MED ORDER — ONDANSETRON 8 MG PO TBDP
8.0000 mg | ORAL_TABLET | Freq: Once | ORAL | Status: AC
  Administered 2022-08-15: 8 mg via ORAL
  Filled 2022-08-15: qty 1

## 2022-08-15 MED ORDER — OXYCODONE-ACETAMINOPHEN 5-325 MG PO TABS: 1.0000 | ORAL_TABLET | Freq: Four times a day (QID) | ORAL | 0 refills | Status: AC | PRN

## 2022-08-15 NOTE — ED Provider Notes (Signed)
[facility] Provider Note  Patient Contact: 8:37 PM (approximate)   History   Arm Injury   HPI  Erica Hurley is a 81 y.o. female who presents the emergency department with left shoulder injury after mechanical fall.  Patient was helping carrying her great grandson for diaper change when she fell.  Patient states that the child was squirming, caused her to lose balance.  Patient fell and landed directly on her shoulder.  Did not hit her head or lose consciousness.  Complaining of sharp pain to the shoulder joint at this time.  Again no other musculoskeletal pain or complaints     Physical Exam   Triage Vital Signs: ED Triage Vitals  Enc Vitals Group     BP 08/15/22 2011 (!) 151/77     Pulse Rate 08/15/22 2011 99     Resp 08/15/22 2011 18     Temp 08/15/22 2011 98.1 F (36.7 C)     Temp Source 08/15/22 2011 Oral     SpO2 08/15/22 2011 96 %     Weight 08/15/22 2011 198 lb (89.8 kg)     Height 08/15/22 2011 '5\' 2"'$  (1.575 m)     Head Circumference --      Peak Flow --      Pain Score 08/15/22 2010 10     Pain Loc --      Pain Edu? --      Excl. in [place]? --     Most recent vital signs: Vitals:   08/15/22 2011  BP: (!) 151/77  Pulse: 99  Resp: 18  Temp: 98.1 F (36.7 C)  SpO2: 96%     General: Alert and in no acute distress. Eyes:  PERRL. EOMI. Head: No acute traumatic findings  Neck: No stridor. No cervical spine tenderness to palpation.  Cardiovascular:  Good peripheral perfusion Respiratory: Normal respiratory effort without tachypnea or retractions. Lungs CTAB. Good air entry to the bases with no decreased or absent breath sounds. Musculoskeletal: Full range of motion to all extremities.  Limited range of motion of the left upper extremity.  No obvious deformity.  Exquisitely tender to palpation about the humeral head and lateral aspect of the acromion process and clavicle.  Examination of the elbow and wrist is unremarkable.  Radial pulse  intact distally. Neurologic:  No gross focal neurologic deficits are appreciated.  Skin:   No rash noted Other:   ED Results / Procedures / Treatments   Labs (all labs ordered are listed, but only abnormal results are displayed) Labs Reviewed  CBG MONITORING, ED - Abnormal; Notable for the following components:      Result Value   Glucose-Capillary 172 (*)    All other components within normal limits     EKG     RADIOLOGY  I personally viewed, evaluated, and interpreted these images as part of my medical decision making, as well as reviewing the written report by the radiologist.  ED Provider Interpretation: Fracture of the humerus along the neck.  Impacted fracture with no dislocation.  DG Shoulder Left  Result Date: 08/15/2022 CLINICAL DATA:  Status post fall. EXAM: LEFT SHOULDER - 2+ VIEW COMPARISON:  None Available. FINDINGS: An acute, impacted fracture deformity is seen involving the head and surgical neck the proximal left humerus. There is no evidence of dislocation. Mild degenerative changes are seen involving the left acromioclavicular joint and left glenohumeral articulation. Mild to moderate severity lateral soft tissue swelling is seen. IMPRESSION: Acute fracture  of the proximal left humerus. Electronically Signed   By: Virgina Norfolk M.D.   On: 08/15/2022 20:52   DG Humerus Left  Result Date: 08/15/2022 CLINICAL DATA:  Status post fall. EXAM: LEFT HUMERUS - 2+ VIEW COMPARISON:  None Available. FINDINGS: An acute, impacted fracture deformity is seen involving the head and surgical neck of the proximal left humerus. There is no evidence of dislocation. Mild to moderate severity lateral soft tissue swelling is noted. IMPRESSION: Acute fracture of the proximal left humerus. Electronically Signed   By: Virgina Norfolk M.D.   On: 08/15/2022 20:49    PROCEDURES:  Critical Care performed: No  Procedures   MEDICATIONS ORDERED IN ED: Medications   oxyCODONE-acetaminophen (PERCOCET/ROXICET) 5-325 MG per tablet 1 tablet (1 tablet Oral Given 08/15/22 2043)  ondansetron (ZOFRAN-ODT) disintegrating tablet 8 mg (8 mg Oral Given 08/15/22 2042)     IMPRESSION / MDM / ASSESSMENT AND PLAN / ED COURSE  I reviewed the triage vital signs and the nursing notes.                              Differential diagnosis includes, but is not limited to, shoulder contusion, shoulder fracture, shoulder dislocation  Patient's presentation is most consistent with acute presentation with potential threat to life or bodily function.   Patient's diagnosis is consistent with proximal humeral fracture.  Patient presented to the emergency department after falling and landing on her left shoulder.  She did not hit her head, lose consciousness and has no other complaints other than left shoulder pain.  Decreased range of motion was observed.  Imaging reveals findings consistent with proximal humeral fracture.  Patiently placed in sling, pain medication prescribed.  Follow-up with orthopedics..  Patient is given ED precautions to return to the ED for any worsening or new symptoms.        FINAL CLINICAL IMPRESSION(S) / ED DIAGNOSES   Final diagnoses:  Other closed nondisplaced fracture of proximal end of left humerus, initial encounter     Rx / DC Orders   ED Discharge Orders          Ordered    oxyCODONE-acetaminophen (PERCOCET/ROXICET) 5-325 MG tablet  Every 6 hours PRN        08/15/22 2128             Note:  This document was prepared using Dragon voice recognition software and may include unintentional dictation errors.   Brynda Peon 08/15/22 2128    Harvest Dark, MD 08/25/22 1447

## 2022-08-15 NOTE — ED Triage Notes (Signed)
Pt arrived via [facility] from home, pt states she was getting ready to change her grandsons diaper, lost her balance when she went to pick him up and fell onto her L arm, denies hitting her head, states she is having upper arm pain, pt has decreased range of motion to L arm.

## 2024-02-22 IMAGING — MR MR NECK SOFT TISSUE ONLY WO/W CM
10 of 11 series · 44 of 48 positions shown · IV contrast (gadavist)
Comparison: 8585

CLINICAL DATA: Squamous cell carcinoma of scalp

EXAM:
MRI OF THE NECK WITH CONTRAST
TECHNIQUE: Multiplanar, multisequence MR imaging was performed following the
administration of intravenous contrast.
CONTRAST:  9mL GADAVIST GADOBUTROL 1 MMOL/ML IV SOLN

[Series 2: T1 · sagittal · 4.0mm · 0.75mm/px · 5 of 37 slices shown (1 of 4)]
[im 1/37]
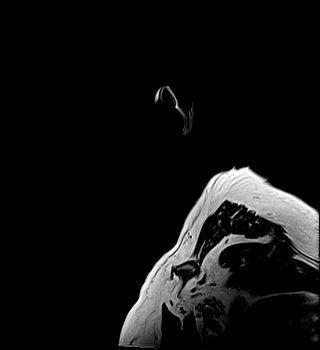
[im 10/37]
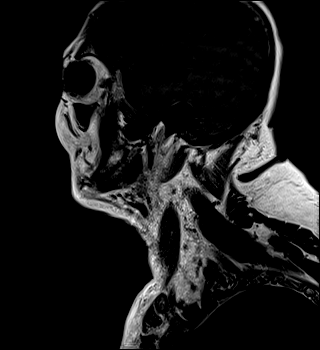
[im 19/37]
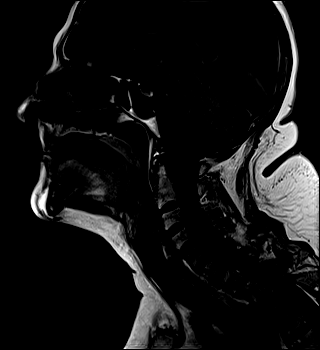
[im 28/37]
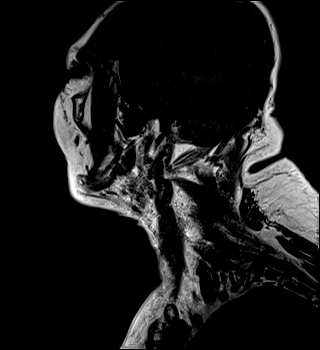
[im 37/37]
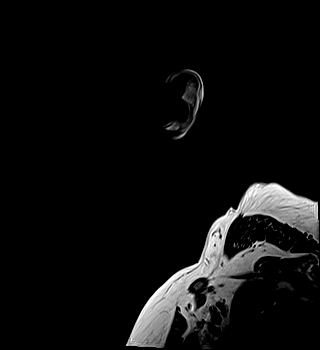

[Series 4: T2 · sagittal · 4.0mm · 0.75mm/px · 5 of 37 slices shown (1 of 3)]
[im 1/37]
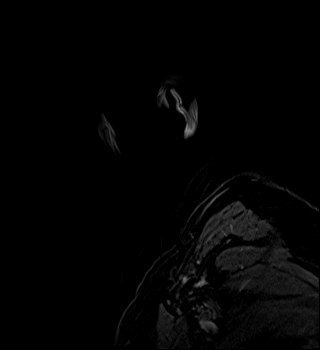
[im 10/37]
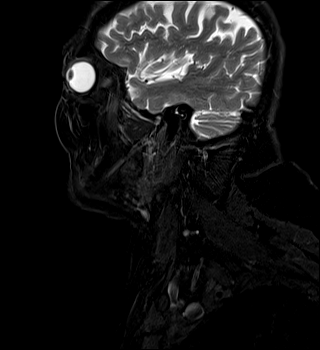
[im 19/37]
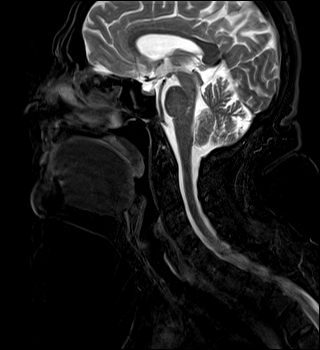
[im 28/37]
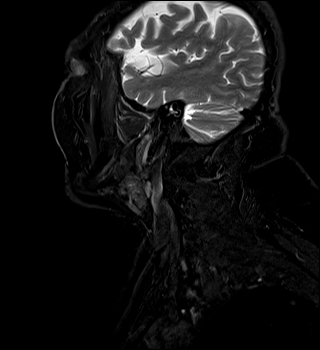
[im 37/37]
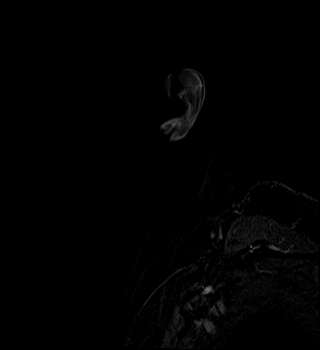

[Series 5: T1 · coronal · 5.0mm · 0.69mm/px · 3 of 28 slices shown (2 of 4)]
[im 1/28]
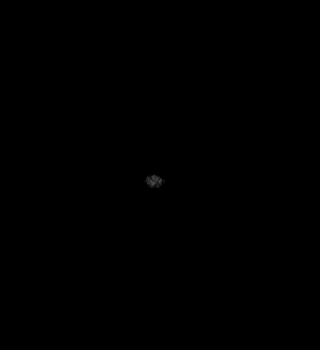
[im 14/28]
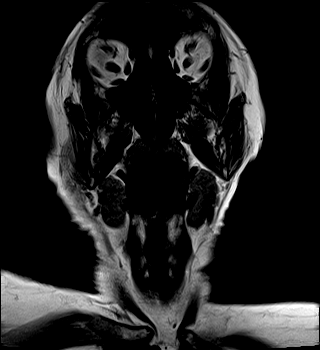
[im 28/28]
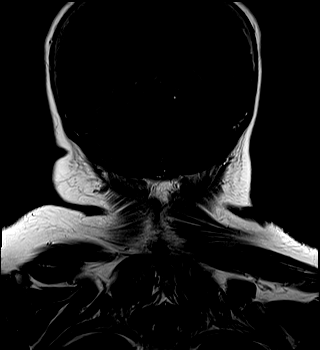

[Series 7: T2 · coronal · 5.0mm · 0.69mm/px · 3 of 28 slices shown (2 of 3)]
[im 1/28]
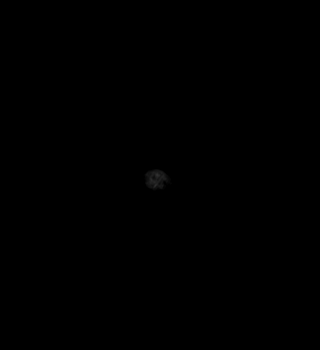
[im 14/28]
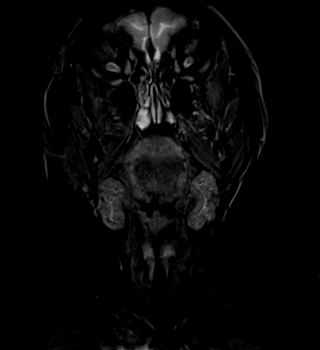
[im 28/28]
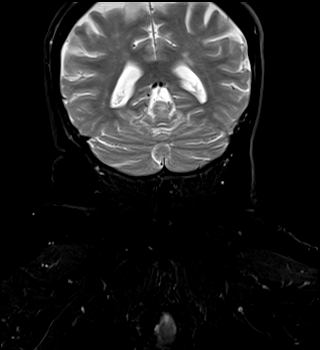

[Series 8: T1 · axial · 4.5mm · 0.62mm/px · z∈[-223,+13]mm · 5 of 48 slices shown (3 of 4)]
[im 1/48]
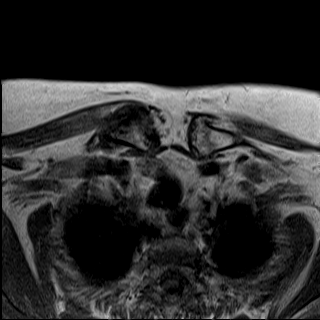
[im 12/48]
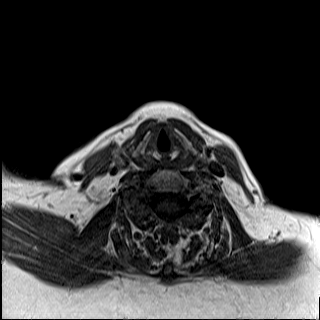
[im 24/48]
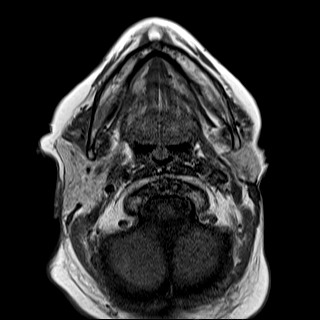
[im 36/48]
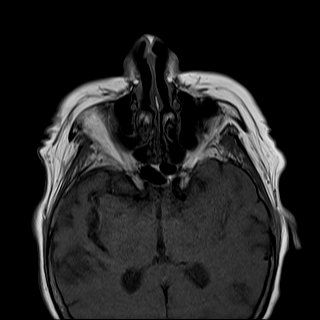
[im 48/48]
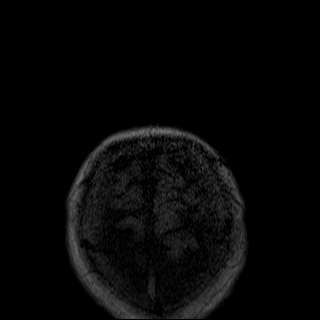

[Series 10: T1 · axial · 4.5mm · 0.62mm/px · z∈[-223,+13]mm · 5 of 48 slices shown (4 of 4)]
[im 1/48]
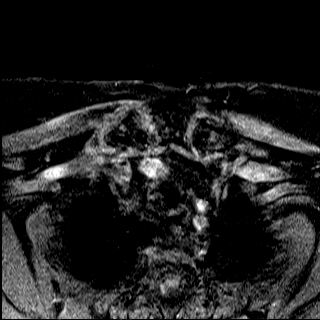
[im 12/48]
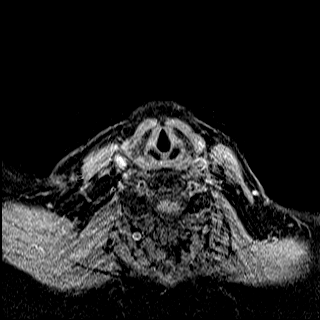
[im 24/48]
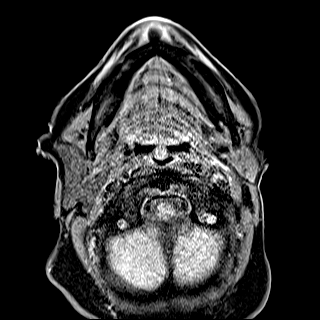
[im 36/48]
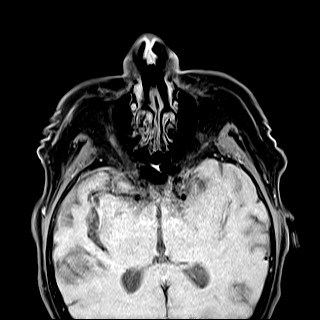
[im 48/48]
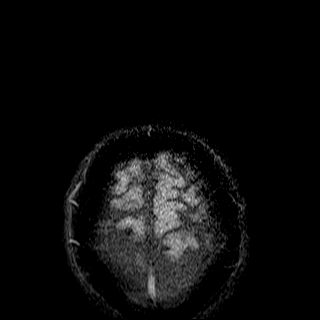

[Series 12: T2 · axial · 4.5mm · 0.62mm/px · z∈[-223,+13]mm · 5 of 48 slices shown (3 of 3)]
[im 1/48]
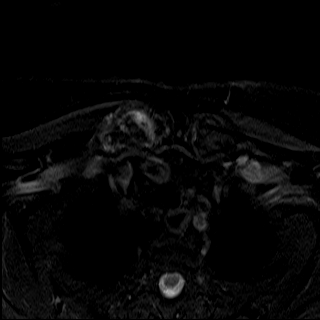
[im 12/48]
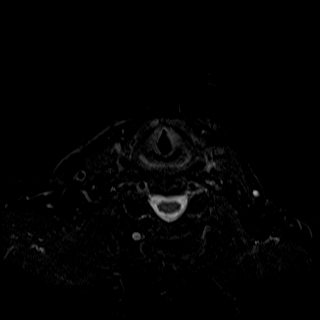
[im 24/48]
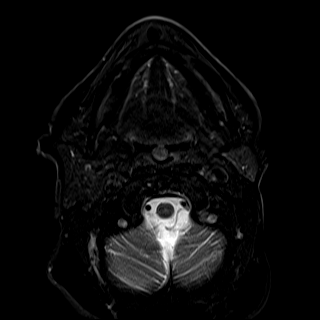
[im 36/48]
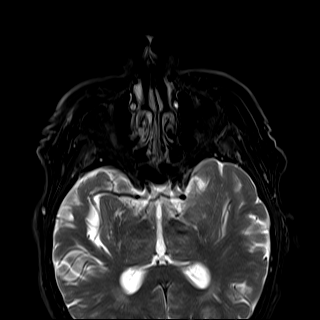
[im 48/48]
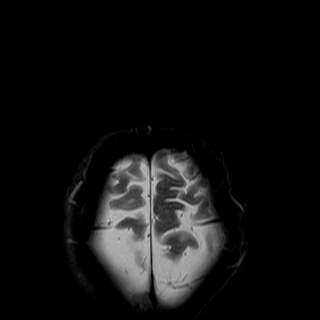

[Series 13: T1 fat-sat · axial · 4.5mm · 0.62mm/px · z∈[-223,+13]mm · 5 of 48 slices shown (1 of 3)]
[im 1/48]
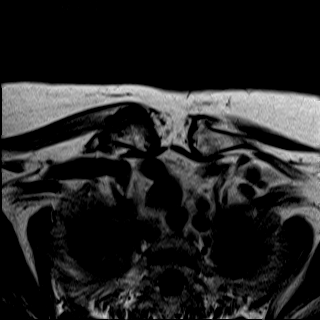
[im 12/48]
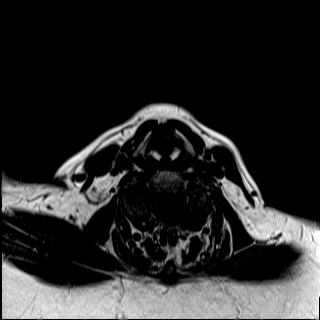
[im 24/48]
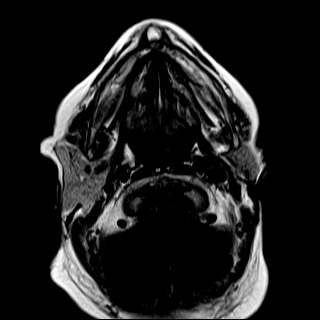
[im 36/48]
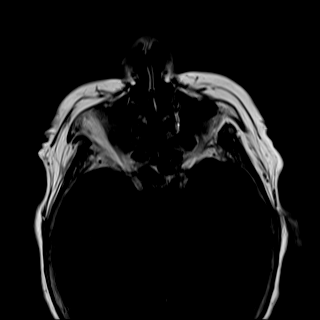
[im 48/48]
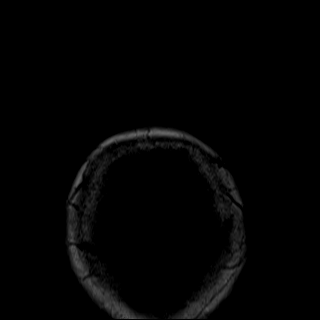

[Series 14: T1 fat-sat · axial · 4.5mm · 0.62mm/px · z∈[-223,+13]mm · 5 of 48 slices shown (2 of 3)]
[im 1/48]
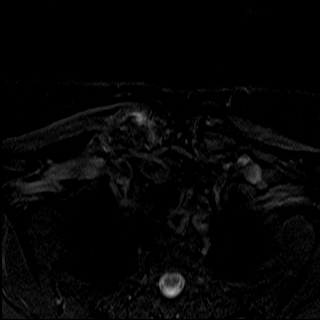
[im 12/48]
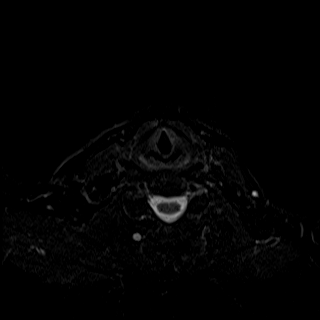
[im 24/48]
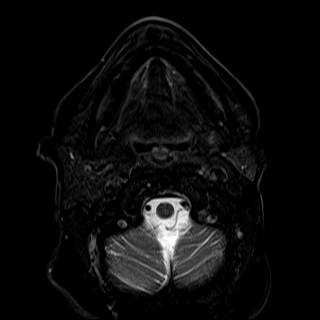
[im 36/48]
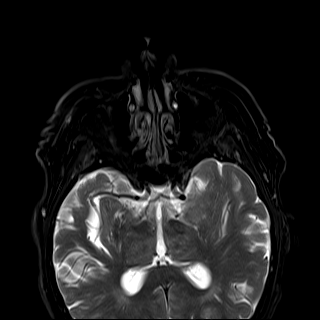
[im 48/48]
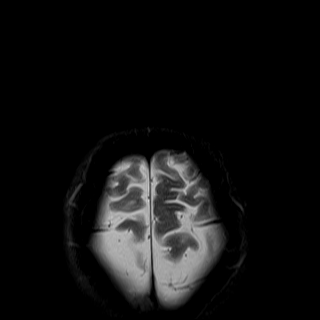

[Series 16: T1 fat-sat · sagittal · 4.0mm · 0.75mm/px · 3 of 37 slices shown (3 of 3)]
[im 1/37]
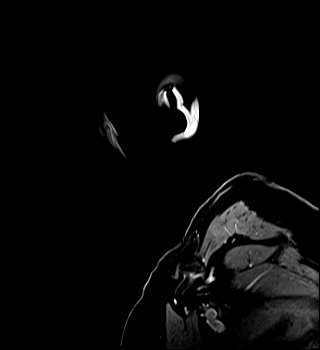
[im 13/37]
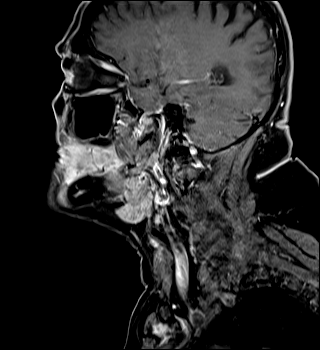
[im 25/37]
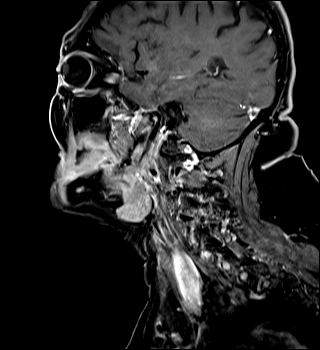

[44 of 48 positions shown; findings below may reference images not displayed]

FINDINGS: Post axial T1 imaging was not obtained due to a technical error.
Coronal and sagittal post T1 sequences were obtained.

Pharynx and larynx: Unremarkable.  No mass or swelling.

Salivary glands: Interval left partial parotidectomy with resection
of lesions on prior study. Stable chronically present right parotid
lesions. Submandibular glands are unremarkable.

Thyroid: No new finding.

Lymph nodes: No enlarged nodes.

Vascular: Major neck vessel flow voids are preserved.

Limited intracranial: Dictated separately.

Visualized orbits: Unremarkable.

Mastoids and visualized paranasal sinuses: No significant
opacification.

Skeleton: Marrow signal is within normal limits.

Upper chest: Minimally included and unremarkable.

Other: None.
IMPRESSION: Interval resection of left parotid lesions. Stable chronically
present right parotid lesions.

No new neck mass or adenopathy.

## 2024-02-22 IMAGING — MR MR HEAD WO/W CM
14 series · 48 of 48 positions shown · IV contrast (gadavist)
Comparison: None.

CLINICAL DATA: Invasive basal cell CA removed on LT side of neck.
Hx of parotid tumors.

EXAM:
MRI HEAD WITHOUT AND WITH CONTRAST
TECHNIQUE: Multiplanar, multiecho pulse sequences of the brain and surrounding
structures were obtained without and with intravenous contrast.
CONTRAST:  9mL GADAVIST GADOBUTROL 1 MMOL/ML IV SOLN

[Series 5: ax dwi_tracew · axial · 3.0mm · 0.65mm/px · z∈[-68,+80]mm · 2 of 48 slices shown]
[im 1/48]
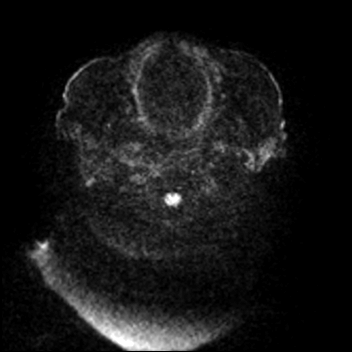
[im 48/48]
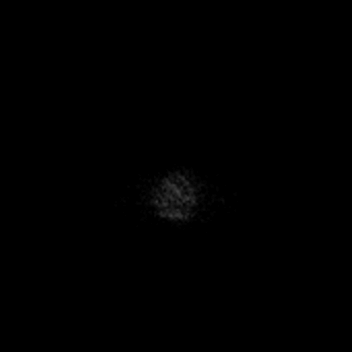

[Series 6: ax dwi_adc · axial · 3.0mm · 0.65mm/px · z∈[-68,+80]mm · 3 of 48 slices shown]
[im 1/48]
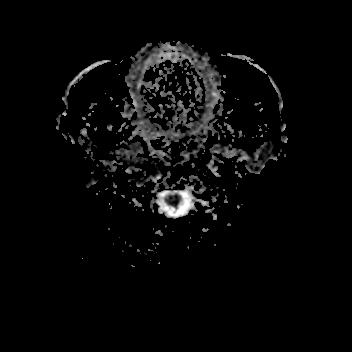
[im 24/48]
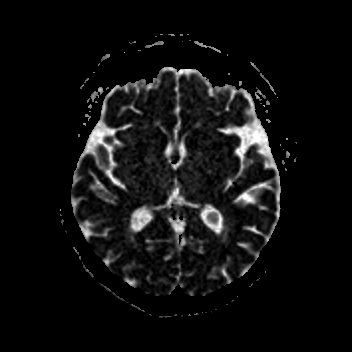
[im 48/48]
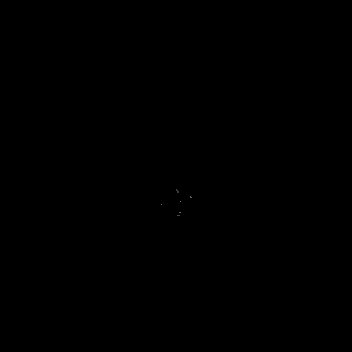

[Series 7: cor dwi_tracew · coronal · 5.0mm · 0.60mm/px · 2 of 36 slices shown]
[im 1/36]
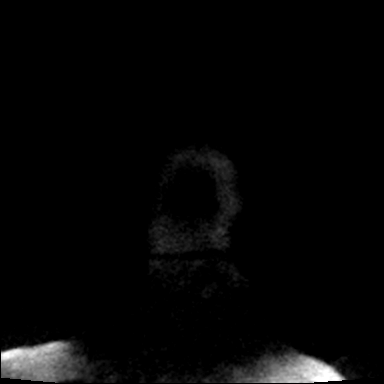
[im 36/36]
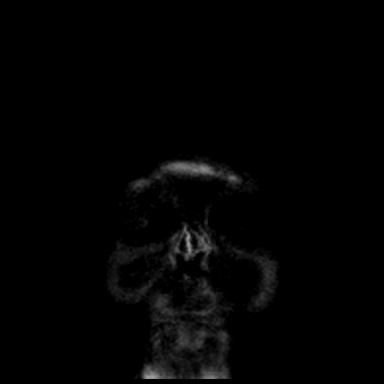

[Series 8: cor dwi_adc · coronal · 5.0mm · 0.60mm/px · 2 of 35 slices shown]
[im 1/35]
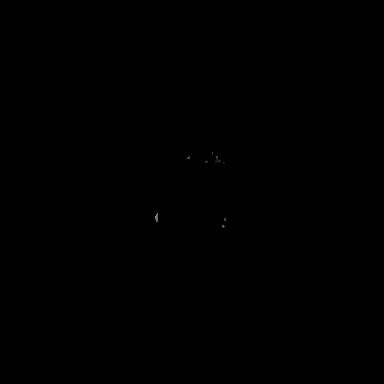
[im 35/35]
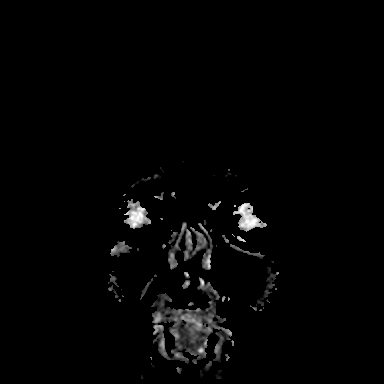

[Series 9: T1 · sagittal · 5.0mm · 0.62mm/px · 1 of 23 slices shown (1 of 2)]
[im 1/23]
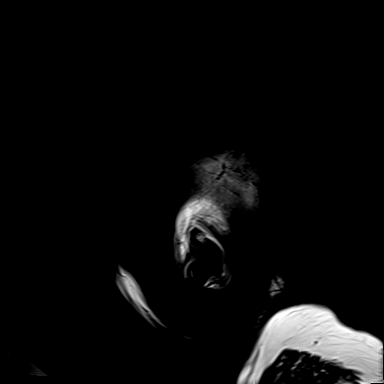

[Series 10: T2 · axial · 5.0mm · 0.53mm/px · z∈[-71,+78]mm · 2 of 27 slices shown]
[im 1/27]
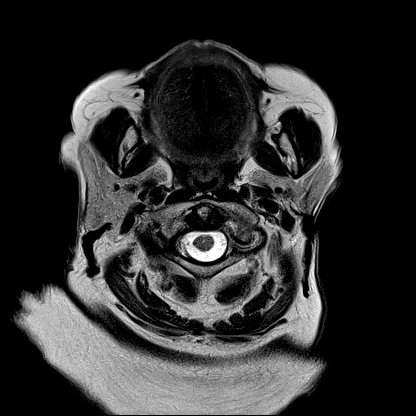
[im 27/27]
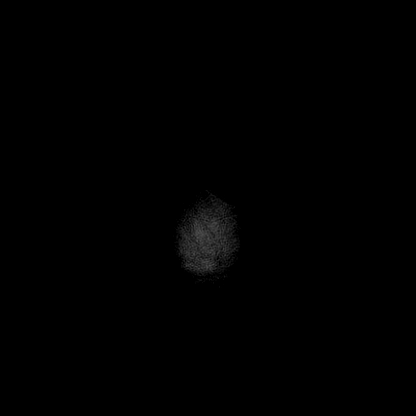

[Series 12: ax swi_pha · axial · 2.0mm · 0.90mm/px · z∈[-70,+81]mm · 5 of 80 slices shown]
[im 1/80]
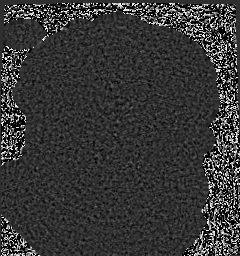
[im 20/80]
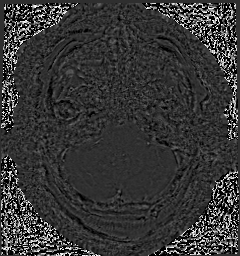
[im 40/80]
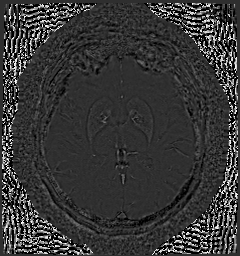
[im 60/80]
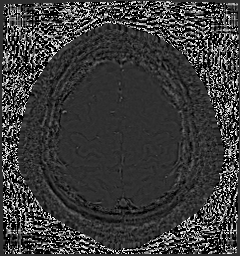
[im 80/80]
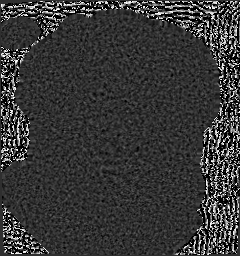

[Series 13: ax swi_swi · axial · 2.0mm · 0.90mm/px · z∈[-70,+81]mm · 5 of 80 slices shown]
[im 1/80]
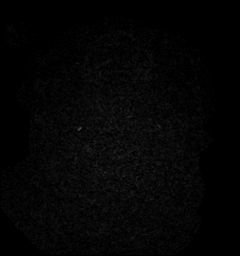
[im 20/80]
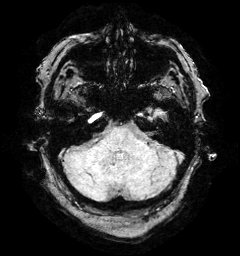
[im 40/80]
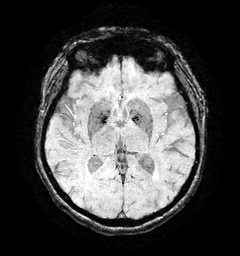
[im 60/80]
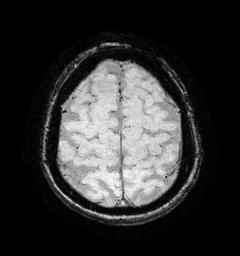
[im 80/80]
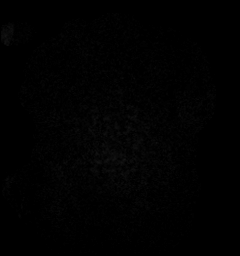

[Series 15: FLAIR · axial · 3.0mm · 0.53mm/px · z∈[-74,+81]mm · 3 of 55 slices shown]
[im 1/55]
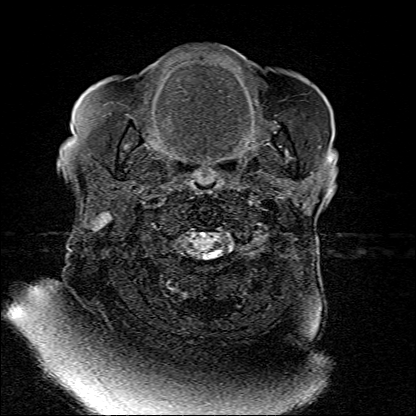
[im 28/55]
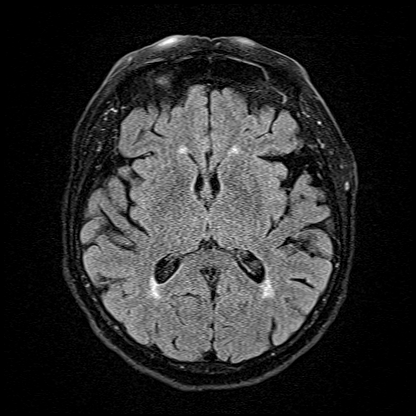
[im 55/55]
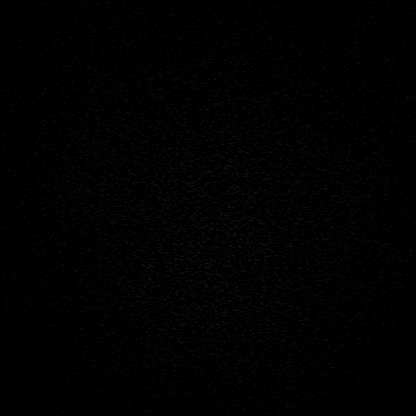

[Series 16: T1 · axial · 1.0mm · 0.98mm/px · z∈[-67,+85]mm · 9 of 160 slices shown (2 of 2)]
[im 1/160]
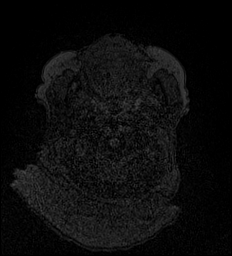
[im 20/160]
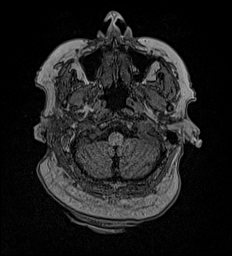
[im 40/160]
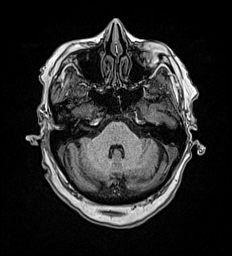
[im 60/160]
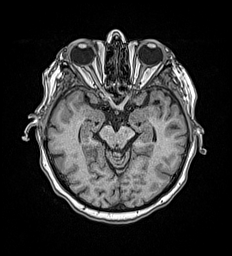
[im 80/160]
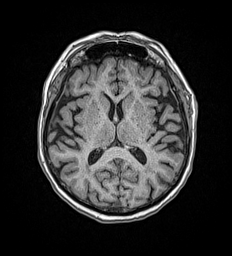
[im 100/160]
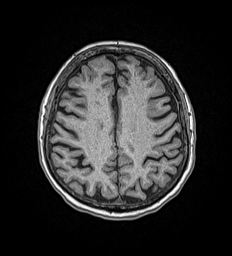
[im 120/160]
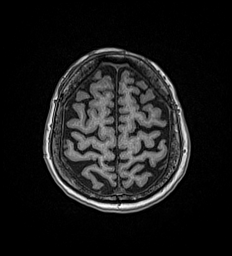
[im 140/160]
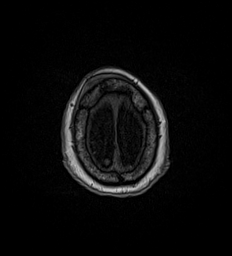
[im 160/160]
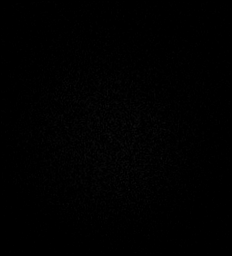

[Series 17: T2 post-contrast · coronal · 5.0mm · 0.57mm/px · 2 of 27 slices shown]
[im 1/27]
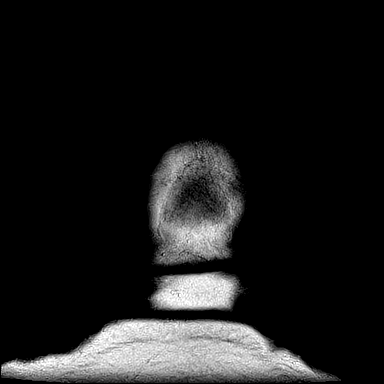
[im 27/27]
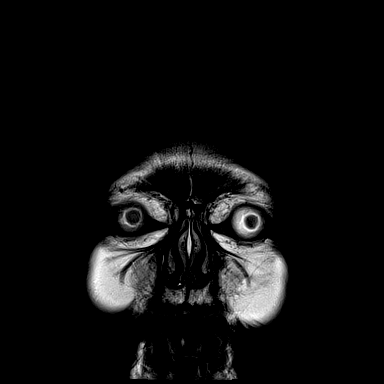

[Series 18: T1 post-contrast · axial · 1.0mm · 0.98mm/px · z∈[-67,+85]mm · 9 of 160 slices shown (1 of 3)]
[im 1/160]
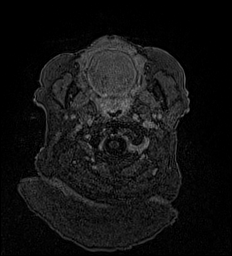
[im 20/160]
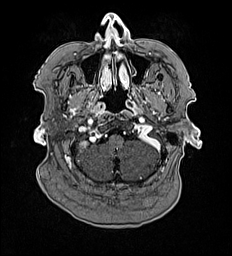
[im 40/160]
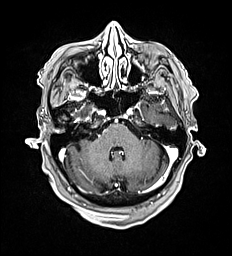
[im 60/160]
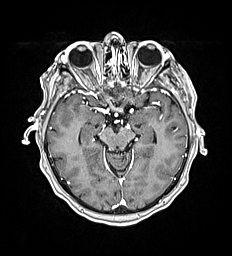
[im 80/160]
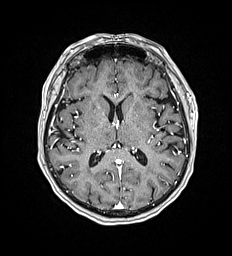
[im 100/160]
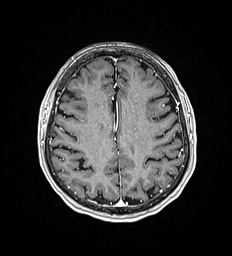
[im 120/160]
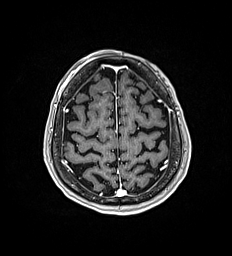
[im 140/160]
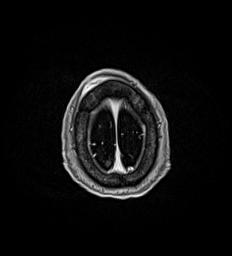
[im 160/160]
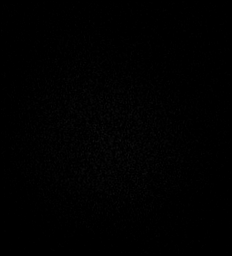

[Series 19: T1 post-contrast · coronal · 5.0mm · 0.57mm/px · 2 of 27 slices shown (2 of 3)]
[im 1/27]
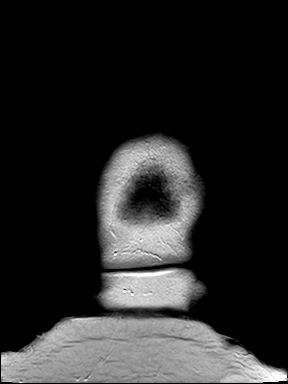
[im 27/27]
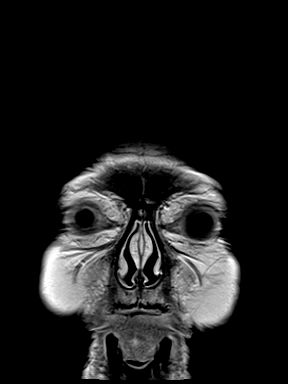

[Series 20: T1 post-contrast · sagittal · 5.0mm · 0.62mm/px · 1 of 23 slices shown (3 of 3)]
[im 1/23]
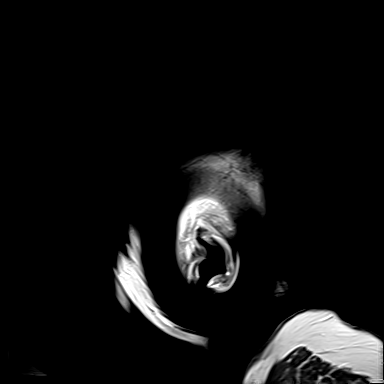

[48 of 48 positions shown; findings below may reference images not displayed]

FINDINGS: Brain: No acute infarction, hemorrhage, hydrocephalus, extra-axial
collection or mass lesion. Mild scattered T2/FLAIR hyperintensities
in the white matter, nonspecific but compatible with chronic
microvascular ischemic disease. No pathologic enhancement.

Vascular: Major arterial flow voids are maintained at the skull
base.

Skull and upper cervical spine: Normal marrow signal.

Sinuses/Orbits: Clear sinuses.  No acute orbital findings.

Other: Please see concurrent MRI of the neck for evaluation of the
neck.
IMPRESSION: 1. No evidence of acute intracranial abnormality or metastatic
disease.
2. Please see concurrent MRI of the neck for evaluation of the neck.

## 2024-08-17 ENCOUNTER — Other Ambulatory Visit: Payer: Self-pay

## 2024-08-17 ENCOUNTER — Inpatient Hospital Stay

## 2024-08-17 ENCOUNTER — Inpatient Hospital Stay: Attending: Oncology | Admitting: Oncology

## 2024-08-17 ENCOUNTER — Inpatient Hospital Stay
Admission: RE | Admit: 2024-08-17 | Discharge: 2024-08-17 | Disposition: A | Payer: Self-pay | Source: Ambulatory Visit | Attending: Oncology

## 2024-08-17 ENCOUNTER — Encounter: Payer: Self-pay | Admitting: Oncology

## 2024-08-17 VITALS — BP 107/59 | HR 74 | Temp 97.0°F | Resp 18 | Ht 62.0 in | Wt 149.8 lb

## 2024-08-17 DIAGNOSIS — Z7189 Other specified counseling: Secondary | ICD-10-CM

## 2024-08-17 DIAGNOSIS — K8689 Other specified diseases of pancreas: Secondary | ICD-10-CM | POA: Diagnosis not present

## 2024-08-17 DIAGNOSIS — K869 Disease of pancreas, unspecified: Secondary | ICD-10-CM

## 2024-08-17 DIAGNOSIS — D37030 Neoplasm of uncertain behavior of the parotid salivary glands: Secondary | ICD-10-CM

## 2024-08-17 DIAGNOSIS — E279 Disorder of adrenal gland, unspecified: Secondary | ICD-10-CM | POA: Insufficient documentation

## 2024-08-17 DIAGNOSIS — I350 Nonrheumatic aortic (valve) stenosis: Secondary | ICD-10-CM | POA: Insufficient documentation

## 2024-08-17 LAB — CMP (CANCER CENTER ONLY)
ALT: 9 U/L (ref 0–44)
AST: 16 U/L (ref 15–41)
Albumin: 3.3 g/dL — ABNORMAL LOW (ref 3.5–5.0)
Alkaline Phosphatase: 80 U/L (ref 38–126)
Anion gap: 7 (ref 5–15)
BUN: 11 mg/dL (ref 8–23)
CO2: 25 mmol/L (ref 22–32)
Calcium: 9.5 mg/dL (ref 8.9–10.3)
Chloride: 105 mmol/L (ref 98–111)
Creatinine: 1.1 mg/dL — ABNORMAL HIGH (ref 0.44–1.00)
GFR, Estimated: 50 mL/min — ABNORMAL LOW (ref >60)
Glucose, Bld: 206 mg/dL — ABNORMAL HIGH (ref 70–99)
Potassium: 4.1 mmol/L (ref 3.5–5.1)
Sodium: 137 mmol/L (ref 135–145)
Total Bilirubin: 0.6 mg/dL (ref 0.0–1.2)
Total Protein: 6.7 g/dL (ref 6.5–8.1)

## 2024-08-17 LAB — CBC WITH DIFFERENTIAL (CANCER CENTER ONLY)
Abs Immature Granulocytes: 0.02 K/uL (ref 0.00–0.07)
Basophils Absolute: 0.1 K/uL (ref 0.0–0.1)
Basophils Relative: 1 %
Eosinophils Absolute: 0.4 K/uL (ref 0.0–0.5)
Eosinophils Relative: 5 %
HCT: 41.2 % (ref 36.0–46.0)
Hemoglobin: 13.8 g/dL (ref 12.0–15.0)
Immature Granulocytes: 0 %
Lymphocytes Relative: 27 %
Lymphs Abs: 2.3 K/uL (ref 0.7–4.0)
MCH: 30.3 pg (ref 26.0–34.0)
MCHC: 33.5 g/dL (ref 30.0–36.0)
MCV: 90.5 fL (ref 80.0–100.0)
Monocytes Absolute: 0.4 K/uL (ref 0.1–1.0)
Monocytes Relative: 5 %
Neutro Abs: 5.3 K/uL (ref 1.7–7.7)
Neutrophils Relative %: 62 %
Platelet Count: 172 K/uL (ref 150–400)
RBC: 4.55 MIL/uL (ref 3.87–5.11)
RDW: 13.1 % (ref 11.5–15.5)
WBC Count: 8.6 K/uL (ref 4.0–10.5)
nRBC: 0 % (ref 0.0–0.2)

## 2024-08-17 NOTE — Progress Notes (Signed)
 Hematology/Oncology Consult note [facility] Telephone:([phone] [phone]  Patient Care Team: Sadie Manna, MD as PCP - General (Internal Medicine)   Name of the patient: Erica Hurley  [id number]  1941/11/10    Reason for referral-pancreatic mass   Referring physician- Dr. Sadie  Date of visit: 08/17/24   History of presenting illness-patient is a 83 year old female with a past medical history significant for type 2 diabetes. This who underwent a CT chest abdomen pelvis with contrast and a CT of the heart in September 2025.  This was done as a part of planning for TAVR procedure.   CT showed severe aortic valve leaflet thickening compatible with aortic stenosis.  Hyporet attenuating pancreatic mass in the proximal body/neck of the pancreas measuring 2.7 x 2.5 cm with downstream pancreatic dilatation and pancreatic atrophy.  Lesion abuts the anterior aspect of the origin of the main portal vein at the confluence of splenic and superior mesenteric vein.  Lesion abuts the inferior aspect of the common hepatic artery.  Subcentimeter peripancreatic lymph nodes measuring up to 7 mm.  Enhancing soft tissue just superior to the mass anterior to the main portal vein measuring 13 x 13 mm may represent superior extension versus separate peripancreatic lymph node.  Heterogeneously enhancing left adrenal nodule measuring 3 x 2.4 cm.  This was indeterminate.  Scattered micronodules in the lungs indeterminate.  She has a history of diabetes and aortic stenosis. She reports recent weight loss and poor appetite, and her A1c levels have been rising, although her blood sugar levels have not been significantly elevated. Her A1c levels have been rising.  She is currently awaiting a TAVR procedure for her aortic stenosis, which has been put on hold pending further evaluation of the pancreatic mass.  ECOG PS- 2  Pain scale- 0   Review of systems- Review of Systems   Constitutional:  Positive for malaise/fatigue and weight loss. Negative for chills and fever.  HENT:  Negative for congestion, ear discharge and nosebleeds.   Eyes:  Negative for blurred vision.  Respiratory:  Negative for cough, hemoptysis, sputum production, shortness of breath and wheezing.   Cardiovascular:  Negative for chest pain, palpitations, orthopnea and claudication.  Gastrointestinal:  Negative for abdominal pain, blood in stool, constipation, diarrhea, heartburn, melena, nausea and vomiting.  Genitourinary:  Negative for dysuria, flank pain, frequency, hematuria and urgency.  Musculoskeletal:  Negative for back pain, joint pain and myalgias.  Skin:  Negative for rash.  Neurological:  Negative for dizziness, tingling, focal weakness, seizures, weakness and headaches.  Endo/Heme/Allergies:  Does not bruise/bleed easily.  Psychiatric/Behavioral:  Negative for depression and suicidal ideas. The patient does not have insomnia.     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   Type 2 diabetes mellitus without complication, with long-term current use of insulin  (HCC)      Past Medical History:  Diagnosis Date   Diabetes mellitus without complication (HCC)      Past Surgical History:  Procedure Laterality Date   BLEPHAROPLASTY Bilateral    CHOLECYSTECTOMY     KNEE ARTHROSCOPY Right    PAROTIDECTOMY Left 10/20/2019   Procedure: SUPERFICIAL PAROTIDECTOMY;  Surgeon: Edda Mt, MD;  Location: [facility] ORS;  Service: ENT;  Laterality: Left;   TONSILLECTOMY  Social History   Socioeconomic History   Marital status: Divorced    Spouse name: Not on file   Number of children: Not on file   Years of education: Not on file   Highest education level: Not on file   Occupational History   Not on file  Tobacco Use   Smoking status: Every Day    Current packs/day: 1.00    Types: Cigarettes   Smokeless tobacco: Never  Vaping Use   Vaping status: Never Used  Substance and Sexual Activity   Alcohol  use: Never   Drug use: Never   Sexual activity: Not on file  Other Topics Concern   Not on file  Social History Narrative   Not on file   Social Drivers of Health   Financial Resource Strain: Low Risk  (06/04/2024)   Received from [facility]   Overall Financial Resource Strain (CARDIA)    Difficulty of Paying Living Expenses: Not hard at all  Food Insecurity: No Food Insecurity (06/04/2024)   Received from [facility]   Hunger Vital Sign    Within the past 12 months, you worried that your food would run out before you got the money to buy more.: Never true    Within the past 12 months, the food you bought just didn't last and you didn't have money to get more.: Never true  Transportation Needs: No Transportation Needs (06/04/2024)   Received from [facility] - Transportation    In the past 12 months, has lack of transportation kept you from medical appointments or from getting medications?: No    Lack of Transportation (Non-Medical): No  Physical Activity: Not on file  Stress: Not on file  Social Connections: Not on file  Intimate Partner Violence: Not on file     No family history on file.   Current Outpatient Medications:    buPROPion  (WELLBUTRIN  SR) 150 MG 12 hr tablet, Take 150 mg by mouth 2 (two) times daily., Disp: , Rfl:    fexofenadine (ALLEGRA) 180 MG tablet, Take 180 mg by mouth daily., Disp: , Rfl:    hydroxypropyl methylcellulose / hypromellose (ISOPTO TEARS / GONIOVISC) 2.5 % ophthalmic solution, Place 1 drop into both eyes daily as needed for dry eyes., Disp: , Rfl:    insulin  NPH-regular Human (70-30) 100 UNIT/ML injection, Inject 35-62 Units into the skin See  admin instructions. Inject 62 units into the skin in the morning and 35 units in the evening, Disp: , Rfl:    naproxen  sodium (ALEVE ) 220 MG tablet, Take 220 mg by mouth daily as needed (pain)., Disp: , Rfl:    oxyCODONE -acetaminophen  (PERCOCET/ROXICET) 5-325 MG tablet, Take 1 tablet by mouth every 6 (six) hours as needed for severe pain., Disp: 20 tablet, Rfl: 0   Physical exam: There were no vitals filed for this visit. Physical Exam Constitutional:      Comments: Elderly frail woman in no acute distress  Cardiovascular:     Rate and Rhythm: Normal rate and regular rhythm.     Heart sounds: Murmur heard.  Pulmonary:     Effort: Pulmonary effort is normal.     Breath sounds: Normal breath sounds.  Abdominal:     General: Bowel sounds are normal.     Palpations: Abdomen is soft.  Skin:    General: Skin is warm and dry.  Neurological:     Mental Status: She is alert and oriented to person, place, and  time.           Latest Ref Rng & Units 10/21/2019    3:12 AM  CMP  Glucose 70 - 99 mg/dL 746   BUN 8 - 23 mg/dL 18   Creatinine 9.55 - 1.00 mg/dL 9.00   Sodium 864 - 854 mmol/L 138   Potassium 3.5 - 5.1 mmol/L 4.7   Chloride 98 - 111 mmol/L 104   CO2 22 - 32 mmol/L 25   Calcium 8.9 - 10.3 mg/dL 9.4       Latest Ref Rng & Units 10/21/2019    3:12 AM  CBC  WBC 4.0 - 10.5 K/uL 15.1   Hemoglobin 12.0 - 15.0 g/dL 86.7   Hematocrit 63.9 - 46.0 % 40.5   Platelets 150 - 400 K/uL 201     No images are attached to the encounter.  No results found.  Assessment and plan- Patient is a 83 y.o. female who underwent CT scans as a part of TAVR planning procedure incidentally found to have pancreatic mass   Pancreatic body mass, possible pancreatic cancer 3 cm pancreatic body mass, likely pancreatic cancer, non-resectable due to vascular involvement. Biopsy needed for confirmation. Surgery not feasible due to Whipple's intolerance. Untreated life expectancy 6-12 months. - Discuss  case at tumor board on Thursday. - Check CA 19-9 levels. - Consider EUS for biopsy if cardiologist clears for sedation. - Discuss with cardiologist regarding clearance for biopsy. -Discussed differences between resectable, borderline resectable and metastatic pancreatic cancer.  Patient is here with her granddaughter today.  She remains undecided as to whether she wants to pursue a biopsy or not.  We will call her with tumor board recommendations  Left adrenal mass, indeterminate etiology 3 cm left adrenal mass, etiology unknown. Possible metastatic pancreatic cancer or primary adrenal tumor. Relationship to pancreatic mass unclear. - Discuss case at tumor board on Thursday. - Perform blood and urine tests to rule out pheochromocytoma. - Consider adrenal biopsy if pheochromocytoma tests are negative and radiologist recommends.  Aortic stenosis Aortic stenosis with TAVR planned, currently on hold due to pancreatic mass evaluation. May affect sedation clearance for biopsy. - Consult with cardiologist regarding clearance for sedation for biopsy procedures.   Thank you for this kind referral and the opportunity to participate in the care of this patient   Visit Diagnosis 1. Pancreatic mass     Dr. Annah Skene, MD, MPH [facility] [phone] 08/17/2024

## 2024-08-18 LAB — CANCER ANTIGEN 19-9: CA 19-9: 83 U/mL — ABNORMAL HIGH (ref 0–35)

## 2024-08-19 ENCOUNTER — Other Ambulatory Visit

## 2024-08-19 ENCOUNTER — Telehealth: Payer: Self-pay

## 2024-08-19 ENCOUNTER — Encounter: Payer: Self-pay | Admitting: Oncology

## 2024-08-19 DIAGNOSIS — K8689 Other specified diseases of pancreas: Secondary | ICD-10-CM

## 2024-08-19 NOTE — Telephone Encounter (Signed)
 Per TB discussion plan is to first order a CT abdomen and pelvis with contrast to get a better visualization and to see if there's anything else to target besides the left adrenal mass.  Per Tawni Ill no auth needed.  Based on the results of the CT will determine how to move forward with the adrenal biopsy.  Waiting to hear back on scheduling.

## 2024-08-19 NOTE — Telephone Encounter (Signed)
 My chart message received Hi Dr. Melanee, this is Almeda (Ms. Filo's granddaughter) I was hoping you could give me a call to let me know the tumor board suggested. I can be reached at [phone]. Thanks!.  Outbound call; spoke to [place] who indicates patient Says yesterday she kind of shut down, doesn't want to talk to anyone and doesn't want to provide urine sample, just wanted to sleep, not eat or check her blood sugar.  Sugar bottomed out yesterday 39; they thought she was having a stroke, gave her juice and everything was okay.  Granddaughter reports she's doing better today but Doesn't know if she will come in for the CT. Almeda also has questions regards to recent elevated values and if this implicates worsening cancer.  Informed I would touch base with Dr. Melanee when she returns to clinic and loop back with her regarding her questions (and hopefully with a date/time or CT if patient is cooperative to have done); granddaughter verbalized understanding.

## 2024-08-20 ENCOUNTER — Telehealth: Payer: Self-pay | Admitting: Oncology

## 2024-08-20 NOTE — Telephone Encounter (Signed)
 Called pt to sched CT - left vm - LH

## 2024-08-20 NOTE — Telephone Encounter (Signed)
 Called pt to sched CT - LH

## 2024-08-22 LAB — METANEPHRINES, PLASMA
Metanephrine, Free: 29.7 pg/mL (ref 0.0–88.0)
Normetanephrine, Free: 240.6 pg/mL (ref 0.0–297.2)

## 2024-08-23 ENCOUNTER — Telehealth: Payer: Self-pay | Admitting: Oncology

## 2024-08-23 LAB — CATECHOLAMINES, FRACTIONATED, PLASMA
Dopamine: 13.1 pg/mL (ref 0.0–36.7)
Epinephrine: 13.1 pg/mL (ref 0.0–55.4)
Norepinephrine: 791 pg/mL — ABNORMAL HIGH (ref 115–524)

## 2024-08-23 NOTE — Telephone Encounter (Signed)
 Spoke to daughter [name] today.  She lives in [place]  and she is coming tomorrow to see her mom.  She would like to meet with NP Josh Borders on Thursday.  They are leaning towards a more hospice based approach and would like to talk to him in more detail.  Please arrange an appointment with him

## 2024-08-23 NOTE — Telephone Encounter (Signed)
 Patients daughter called to request a call back to let them know what the next steps are for her. Does she need to schedule an appointment?   She is asking about a Urine sample and it not being collected correctly. Does she need to come get a new container.   Please call Kelly [phone]. To let her know how to proceed

## 2024-08-23 NOTE — Telephone Encounter (Signed)
 Dr. Melanee has already followed up with patient after this message has been sent. Plan is to see Erica Hurley this Thursday to discuss a more hospice related approach.

## 2024-08-25 NOTE — Progress Notes (Signed)
 No chief complaint on file.   HPI  Erica Hurley is a 83 y.o. here for an acute issue.   She has a PMH of DM2, CKD, aortic stenosis, pancreatic mass who presents with questions regarding insulin  adjustment.  Not eating as well.  Recent diagnosis and not a surgical candidate.  Followed by oncology.    ROS  Pertinent items are noted in HPI.  Outpatient Encounter Medications as of 08/25/2024  Medication Sig Dispense Refill  . acetaminophen  (TYLENOL ) 500 MG tablet Take by mouth    . blood glucose diagnostic test strip 1 each (1 strip total) 4 (four) times daily Use as instructed. 400 each 1  . blood glucose diagnostic test strip 4 (four) times daily 200 each 5  . blood glucose meter kit as directed 1 each 0  . blood-glucose meter (CONTOUR PLUS BLUE METER) Misc 1 each as directed 1 each 2  . calcium carbonate (CALCIUM 600 ORAL) Take 600 mg by mouth 2 (two) times daily    . cholecalciferol (VITAMIN D3) 400 unit tablet Take 800 Units by mouth once daily    . cyanocobalamin (VITAMIN B12) 1000 MCG tablet Take 1,000 mcg by mouth once daily    . fexofenadine (ALLEGRA) 180 MG tablet Take 180 mg by mouth as needed 1/2 tab daily    . insulin  NPH-REGULAR (HUMULIN 70/30 U-100 KWIKPEN) 100 unit/mL (70-30) pen injector 30 units in AM and 20 units in PM 20 mL 5  . insulin  syringe-needle U-100 1 mL 30 gauge X 7/16 Syrg KUSE TO INJECT INSULIN  TWICE DAILY 100 each 5  . lancets (MICROLET LANCET) USE AS DIRECTED. 200 each 1  . nystatin (MYCOSTATIN) 100,000 unit/gram powder Apply topically 2 (two) times daily 60 g 3  . ONETOUCH ULTRA TEST test strip USE 4 TIMES DAILY AS DIRECTED 400 each 0  . pen needle, diabetic 31 gauge x 3/16 needle Use as directed to inject insulin  with meals and snacks up to 6 times daily. #600 is a 90 day supply. This is a 90 day supply. Please fill for 90 days with 1 refill. 600 each 1  . blood glucose diagnostic test strip 1 each (1 strip total) 4 (four) times daily Test 4x daily.  Pt on insulin  4x daily E11.9 (Patient not taking: Reported on 05/13/2023) 100 each 12  . blood glucose meter kit Use 2 (two) times daily (Patient taking differently: Use 1 each 4 (four) times daily) 1 each 0   No facility-administered encounter medications on file as of 08/25/2024.    Allergies as of 08/25/2024 - Reviewed 08/06/2024  Allergen Reaction Noted  . Bupropion  Hallucination 09/19/2020  . Wellbutrin  [bupropion  hcl] Hallucination 09/19/2020  . Antifungal - imidazole Unknown 03/14/2022  . Ciprofloxacin Unknown 06/29/2014  . Quinolones Rash 03/14/2022  . Sporanox [itraconazole] Unknown 06/29/2014    Past Medical History:  Diagnosis Date  . Aortic stenosis    moderate by TTE 11/21  . Diabetes mellitus type 2, uncomplicated (CMS/HHS-HCC)   . History of chicken pox   . Obesity   . PONV (postoperative nausea and vomiting)   . Seasonal allergic rhinitis   . Tobacco use     Past Surgical History:  Procedure Laterality Date  . Right knee torn meniscus Right 2011  . ARTHROPLASTY TOTAL KNEE Right 10/16/2020   Procedure: ARTHROPLASTY, KNEE, CONDYLE AND PLATEAU; MEDIAL AND LATERAL COMPARTMENTSWITH OR WITHOUT PATELLA RESURFACING (TOTAL KNEE ARTHROPLASTY);  Surgeon: Sande Ozell Mt, MD;  Location: [facility] OR;  Service: Orthopedics;  Laterality: Right;  . blepharoplasy Bilateral    eyes  . CHOLECYSTECTOMY    . EXCISION PAROTID GLAND/TUMOR    . JOINT REPLACEMENT    . TONSILLECTOMY      Vitals:   08/25/24 0958  BP: 126/70    Physical Exam  General. Well appearing; NAD; VS reviewed     HEENT: Sclera and conjunctiva clear; EOMI,  Neck. Supple. No thyromegaly, lymphadenopathy. Lungs. Respirations unlabored;   Extremities:  No edema. Skin. Normal color and turgor Neurologic. Alert and oriented x3   Assessment and Plan 1. Pancreatic mass (HHS-HCC) Recent diagnosis.  Not a surgical candidate.  Possible metastasis.  DNR form signed today.  Also given handout on  healthcare proper turning.  2. Type 2 diabetes mellitus with hyperglycemia, with long-term current use of insulin  (CMS/HHS-HCC) Eating much less.  Has been without insulin  several days.  Given rough guidance on insulin  administration.  She is on 70/30 insulin . Recommend holding if sugars less than 150.  Give 10 units if 150-200.  Give 15 units if 200-300.  Recommend administration after eating.  Family will let us  know if abnormal values.  3. Severe aortic stenosis Holding off on surgery because of recent diagnosis of pancreatic mass.    I have personally performed this service.  [address] [place], [place]

## 2024-08-26 ENCOUNTER — Inpatient Hospital Stay (HOSPITAL_BASED_OUTPATIENT_CLINIC_OR_DEPARTMENT_OTHER): Admitting: Hospice and Palliative Medicine

## 2024-08-26 ENCOUNTER — Encounter: Payer: Self-pay | Admitting: Hospice and Palliative Medicine

## 2024-08-26 VITALS — BP 135/78 | HR 73 | Temp 97.4°F | Resp 16 | Wt 143.3 lb

## 2024-08-26 DIAGNOSIS — K8689 Other specified diseases of pancreas: Secondary | ICD-10-CM

## 2024-08-26 NOTE — Progress Notes (Signed)
 Palliative Medicine [facility] [phone] [phone]   Name: Erica Hurley Date: 08/26/2024 MRN: [id number]  DOB: May 20, 1941  Patient Care Team: Erica Manna, MD as PCP - General (Internal Medicine) Erica Hurley BIRCH, RN as Oncology Nurse Navigator    REASON FOR CONSULTATION: Erica Hurley is a 83 y.o. female with multiple medical problems including diabetes, aortic stenosis who is pending TAVR, now with pancreatic mass.    SOCIAL HISTORY:     reports that she has been smoking cigarettes. She has never used smokeless tobacco. She reports that she does not drink alcohol  and does not use drugs.  Patient lives at home alone.  She has a daughter who lives nearby, another daughter in [place], and a third daughter in [place] .  ADVANCE DIRECTIVES:  Not on file  CODE STATUS: DNR  PAST MEDICAL HISTORY: Past Medical History:  Diagnosis Date   Diabetes mellitus without complication (HCC)     PAST SURGICAL HISTORY:  Past Surgical History:  Procedure Laterality Date   BLEPHAROPLASTY Bilateral    CHOLECYSTECTOMY     KNEE ARTHROSCOPY Right    PAROTIDECTOMY Left 10/20/2019   Procedure: SUPERFICIAL PAROTIDECTOMY;  Surgeon: Erica Mt, MD;  Location: [facility] ORS;  Service: ENT;  Laterality: Left;   TONSILLECTOMY      HEMATOLOGY/ONCOLOGY HISTORY:  Oncology History   No history exists.    ALLERGIES:  is allergic to bupropion  and triazole antifungal agents.  MEDICATIONS:  Current Outpatient Medications  Medication Sig Dispense Refill   calcium carbonate (OSCAL) 1500 (600 Ca) MG TABS tablet Take by mouth 2 (two) times daily with a meal.     Cholecalciferol (VITAMIN D3) 10 MCG (400 UNIT) tablet Take 800 Units by mouth.     cyanocobalamin (VITAMIN B12) 1000 MCG tablet Take 1,000 mcg by mouth.     hydroxypropyl methylcellulose / hypromellose (ISOPTO TEARS / GONIOVISC) 2.5 % ophthalmic solution Place 1 drop into both  eyes daily as needed for dry eyes.     insulin  NPH-regular Human (70-30) 100 UNIT/ML injection Inject 35-62 Units into the skin See admin instructions. Inject 62 units into the skin in the morning and 35 units in the evening     buPROPion  (WELLBUTRIN  SR) 150 MG 12 hr tablet Take 150 mg by mouth 2 (two) times daily. (Patient not taking: Reported on 08/17/2024)     fexofenadine (ALLEGRA) 180 MG tablet Take 180 mg by mouth daily. (Patient not taking: Reported on 08/17/2024)     naproxen  sodium (ALEVE ) 220 MG tablet Take 220 mg by mouth daily as needed (pain). (Patient not taking: Reported on 08/26/2024)     oxyCODONE -acetaminophen  (PERCOCET/ROXICET) 5-325 MG tablet Take 1 tablet by mouth every 6 (six) hours as needed for severe pain. (Patient not taking: Reported on 08/26/2024) 20 tablet 0   No current facility-administered medications for this visit.    VITAL SIGNS: There were no vitals taken for this visit. There were no vitals filed for this visit.  Estimated body mass index is 27.4 kg/m as calculated from the following:   Height as of 08/17/24: 5' 2 (1.575 m).   Weight as of 08/17/24: 149 lb 12.8 oz (67.9 kg).  LABS: CBC:    Component Value Date/Time   WBC 8.6 08/17/2024 1216   WBC 15.1 (H) 10/21/2019 0312   HGB 13.8 08/17/2024 1216   HCT 41.2 08/17/2024 1216   PLT 172 08/17/2024 1216   MCV 90.5 08/17/2024 1216  NEUTROABS 5.3 08/17/2024 1216   LYMPHSABS 2.3 08/17/2024 1216   MONOABS 0.4 08/17/2024 1216   EOSABS 0.4 08/17/2024 1216   BASOSABS 0.1 08/17/2024 1216   Comprehensive Metabolic Panel:    Component Value Date/Time   NA 137 08/17/2024 1215   K 4.1 08/17/2024 1215   CL 105 08/17/2024 1215   CO2 25 08/17/2024 1215   BUN 11 08/17/2024 1215   CREATININE 1.10 (H) 08/17/2024 1215   GLUCOSE 206 (H) 08/17/2024 1215   CALCIUM 9.5 08/17/2024 1215   AST 16 08/17/2024 1215   ALT 9 08/17/2024 1215   ALKPHOS 80 08/17/2024 1215   BILITOT 0.6 08/17/2024 1215   PROT 6.7  08/17/2024 1215   ALBUMIN 3.3 (L) 08/17/2024 1215    RADIOGRAPHIC STUDIES: No results found.  PERFORMANCE STATUS (ECOG) : 1 - Symptomatic but completely ambulatory  Review of Systems Unless otherwise noted, a complete review of systems is negative.  Physical Exam General: NAD Pulmonary: Unlabored Extremities: no edema, no joint deformities Skin: no rashes Neurological: Weakness but otherwise nonfocal  IMPRESSION: I met with patient and her daughter, Erica Hurley.  Patient says she realizes that workup is suggestive of pancreatic cancer.  She has discussed at length with her family and has decided not to pursue biopsy or treatment.  Instead, she wishes to focus on comfort and quality of life.  She says that she is already tired of coming back and forth to medical appointments.  Patient and daughter are interested in hospice involvement at home.  Will coordinate this referral.  Patient endorses poor oral intake.  No pain or other distressing symptoms at present.  Recommended addition of oral nutritional supplements.  Will also refer to nutrition.  Daughter is in process of completing ACP documents.  Per patient, she has a signed DNR order at home.  PLAN: - Best supportive care - Referral to hospice - DNR/DNI - Follow-up as needed  Case and plan discussed with Dr. Melanee  Patient expressed understanding and was in agreement with this plan. She also understands that She can call the clinic at any time with any questions, concerns, or complaints.     Time Total: 40 minutes  Visit consisted of counseling and education dealing with the complex and emotionally intense issues of symptom management and palliative care in the setting of serious and potentially life-threatening illness.Greater than 50%  of this time was spent counseling and coordinating care related to the above assessment and plan.  Signed by: Erica Mower, PhD, NP-C

## 2024-09-03 ENCOUNTER — Inpatient Hospital Stay

## 2024-09-15 ENCOUNTER — Encounter
